# Patient Record
Sex: Female | Born: 1970
Health system: Southern US, Community
[De-identification: ages and names within clinical notes are randomized; demographics above are authoritative.]

## PROBLEM LIST (undated history)

## (undated) DIAGNOSIS — R112 Nausea with vomiting, unspecified: Secondary | ICD-10-CM

## (undated) DIAGNOSIS — G709 Myoneural disorder, unspecified: Secondary | ICD-10-CM

## (undated) DIAGNOSIS — E7800 Pure hypercholesterolemia, unspecified: Secondary | ICD-10-CM

## (undated) DIAGNOSIS — J309 Allergic rhinitis, unspecified: Secondary | ICD-10-CM

## (undated) DIAGNOSIS — Z9889 Other specified postprocedural states: Secondary | ICD-10-CM

## (undated) DIAGNOSIS — Z789 Other specified health status: Secondary | ICD-10-CM

## (undated) DIAGNOSIS — E785 Hyperlipidemia, unspecified: Secondary | ICD-10-CM

## (undated) DIAGNOSIS — K219 Gastro-esophageal reflux disease without esophagitis: Secondary | ICD-10-CM

## (undated) DIAGNOSIS — T8859XA Other complications of anesthesia, initial encounter: Secondary | ICD-10-CM

## (undated) DIAGNOSIS — T4145XA Adverse effect of unspecified anesthetic, initial encounter: Secondary | ICD-10-CM

## (undated) DIAGNOSIS — T7840XA Allergy, unspecified, initial encounter: Secondary | ICD-10-CM

## (undated) HISTORY — DX: Allergy, unspecified, initial encounter: T78.40XA

## (undated) HISTORY — DX: Hyperlipidemia, unspecified: E78.5

## (undated) HISTORY — DX: Allergic rhinitis, unspecified: J30.9

---

## 1998-04-10 ENCOUNTER — Emergency Department (HOSPITAL_COMMUNITY): Admission: EM | Admit: 1998-04-10 | Discharge: 1998-04-10 | Payer: Self-pay | Admitting: Emergency Medicine

## 1998-12-16 ENCOUNTER — Other Ambulatory Visit: Admission: RE | Admit: 1998-12-16 | Discharge: 1998-12-16 | Payer: Self-pay | Admitting: Nurse Practitioner

## 2003-01-02 ENCOUNTER — Other Ambulatory Visit: Admission: RE | Admit: 2003-01-02 | Discharge: 2003-01-02 | Payer: Self-pay | Admitting: Internal Medicine

## 2004-07-25 ENCOUNTER — Other Ambulatory Visit: Admission: RE | Admit: 2004-07-25 | Discharge: 2004-07-25 | Payer: Self-pay | Admitting: Obstetrics and Gynecology

## 2006-11-17 ENCOUNTER — Ambulatory Visit: Payer: Self-pay | Admitting: Internal Medicine

## 2007-06-23 ENCOUNTER — Ambulatory Visit: Payer: Self-pay | Admitting: Endocrinology

## 2007-06-23 DIAGNOSIS — J069 Acute upper respiratory infection, unspecified: Secondary | ICD-10-CM | POA: Insufficient documentation

## 2007-10-03 ENCOUNTER — Ambulatory Visit: Payer: Self-pay | Admitting: Internal Medicine

## 2007-10-03 DIAGNOSIS — H6120 Impacted cerumen, unspecified ear: Secondary | ICD-10-CM | POA: Insufficient documentation

## 2007-10-03 DIAGNOSIS — R42 Dizziness and giddiness: Secondary | ICD-10-CM

## 2007-10-03 DIAGNOSIS — J309 Allergic rhinitis, unspecified: Secondary | ICD-10-CM | POA: Insufficient documentation

## 2007-10-03 HISTORY — DX: Allergic rhinitis, unspecified: J30.9

## 2007-11-30 ENCOUNTER — Telehealth (INDEPENDENT_AMBULATORY_CARE_PROVIDER_SITE_OTHER): Payer: Self-pay | Admitting: *Deleted

## 2008-06-26 ENCOUNTER — Encounter: Payer: Self-pay | Admitting: Internal Medicine

## 2008-08-08 ENCOUNTER — Ambulatory Visit: Payer: Self-pay | Admitting: Internal Medicine

## 2008-08-08 DIAGNOSIS — E785 Hyperlipidemia, unspecified: Secondary | ICD-10-CM | POA: Insufficient documentation

## 2008-08-08 DIAGNOSIS — J019 Acute sinusitis, unspecified: Secondary | ICD-10-CM

## 2008-08-08 HISTORY — DX: Hyperlipidemia, unspecified: E78.5

## 2008-08-09 LAB — CONVERTED CEMR LAB
Cholesterol: 265 mg/dL — ABNORMAL HIGH (ref 0–200)
Total CHOL/HDL Ratio: 3
Triglycerides: 98 mg/dL (ref 0.0–149.0)

## 2009-02-20 ENCOUNTER — Ambulatory Visit: Payer: Self-pay | Admitting: Internal Medicine

## 2009-02-28 LAB — CONVERTED CEMR LAB
Direct LDL: 162.2 mg/dL
HDL: 72.4 mg/dL (ref >39.00)
Total CHOL/HDL Ratio: 4
Triglycerides: 121 mg/dL (ref 0.0–149.0)
VLDL: 24.2 mg/dL (ref 0.0–40.0)

## 2009-04-08 ENCOUNTER — Telehealth: Payer: Self-pay | Admitting: Internal Medicine

## 2009-06-17 ENCOUNTER — Ambulatory Visit: Payer: Self-pay | Admitting: Internal Medicine

## 2009-06-20 LAB — CONVERTED CEMR LAB
ALT: 12 units/L (ref 0–35)
Alkaline Phosphatase: 40 units/L (ref 39–117)
Bilirubin, Direct: 0.2 mg/dL (ref 0.0–0.3)
Total Bilirubin: 0.6 mg/dL (ref 0.3–1.2)
VLDL: 18.8 mg/dL (ref 0.0–40.0)

## 2009-08-30 ENCOUNTER — Ambulatory Visit: Payer: Self-pay | Admitting: Internal Medicine

## 2009-08-30 DIAGNOSIS — H6500 Acute serous otitis media, unspecified ear: Secondary | ICD-10-CM

## 2010-01-27 ENCOUNTER — Ambulatory Visit: Payer: Self-pay | Admitting: Internal Medicine

## 2010-01-27 DIAGNOSIS — M542 Cervicalgia: Secondary | ICD-10-CM

## 2010-03-10 ENCOUNTER — Ambulatory Visit: Payer: Self-pay | Admitting: Internal Medicine

## 2010-03-13 ENCOUNTER — Telehealth: Payer: Self-pay | Admitting: Internal Medicine

## 2010-03-18 ENCOUNTER — Telehealth: Payer: Self-pay | Admitting: Internal Medicine

## 2010-05-09 ENCOUNTER — Ambulatory Visit
Admission: RE | Admit: 2010-05-09 | Discharge: 2010-05-09 | Payer: Self-pay | Source: Home / Self Care | Attending: Internal Medicine | Admitting: Internal Medicine

## 2010-05-09 DIAGNOSIS — M7710 Lateral epicondylitis, unspecified elbow: Secondary | ICD-10-CM | POA: Insufficient documentation

## 2010-05-09 DIAGNOSIS — M7700 Medial epicondylitis, unspecified elbow: Secondary | ICD-10-CM | POA: Insufficient documentation

## 2010-05-23 ENCOUNTER — Ambulatory Visit
Admission: RE | Admit: 2010-05-23 | Discharge: 2010-05-23 | Payer: Self-pay | Source: Home / Self Care | Attending: Internal Medicine | Admitting: Internal Medicine

## 2010-05-23 ENCOUNTER — Encounter: Payer: Self-pay | Admitting: Internal Medicine

## 2010-05-23 DIAGNOSIS — R5383 Other fatigue: Secondary | ICD-10-CM

## 2010-05-23 DIAGNOSIS — M25529 Pain in unspecified elbow: Secondary | ICD-10-CM | POA: Insufficient documentation

## 2010-05-23 DIAGNOSIS — R5381 Other malaise: Secondary | ICD-10-CM | POA: Insufficient documentation

## 2010-05-24 ENCOUNTER — Telehealth: Payer: Self-pay | Admitting: Internal Medicine

## 2010-06-03 NOTE — Assessment & Plan Note (Signed)
Summary: pain behind right ear/cd   Vital Signs:  Patient profile:   40 year old female Height:      63 inches Weight:      129.38 pounds BMI:     23.00 O2 Sat:      99 % on Room air Temp:     98.1 degrees F oral Pulse rate:   61 / minute BP sitting:   90 / 60  (left arm) Cuff size:   regular  Vitals Entered By: Zella Ball Ewing CMA Duncan Dull) (January 27, 2010 4:07 PM)  O2 Flow:  Room air CC: Right ear pain, sore throat, drainage, neck soreness/RE   CC:  Right ear pain, sore throat, drainage, and neck soreness/RE.  History of Present Illness: here for acute - just back from [place]  - sister with colon cancer and CML;  she does not recall a recommendation for the immediate family to have colonoscopy; denies bowel change, abd pain, or blood   Also just got back to [place] at 2 am this am after 2 hr flight with todaty new onset swollen lump behind the angle of the right jaw  and below the ear, assoc with ear pain, fever, sinus congestion, headache, malaise, but no ST, cough and Pt denies CP, worsening sob, doe, wheezing, orthopnea, pnd, worsening LE edema, palps, dizziness or syncope .  This is on top of nasal allergy symtpoms worse in the past few wks, having gotten away from her allergy meds in the past month or so  also with right neck pain worse to turn to the right horizontally, mild to mod, onset x 3 days after reaching high at home and fell immed pain with lookiing up;  no pain unless she turns the head in a certain way, area is nontender to touch,  denies other neck pain, radicular/numbness/weakness of any extremity.    Problems Prior to Update: 1)  Neoplasm, Malignant, Colon, Family Hx, Sibling  (ICD-V16.0) 2)  Neck Pain, Right  (ICD-723.1) 3)  Sinusitis- Acute-nos  (ICD-461.9) 4)  Otitis Media, Serous, Acute, Left  (ICD-381.01) 5)  Hyperlipidemia  (ICD-272.4) 6)  Sinusitis- Acute-nos  (ICD-461.9) 7)  Vertigo  (ICD-780.4) 8)  Cerumen Impaction, Bilateral  (ICD-380.4) 9)  Allergic  Rhinitis  (ICD-477.9) 10)  Uri  (ICD-465.9)  Medications Prior to Update: 1)  Ortho Tri-Cyclen Lo 0.025 Mg  Tabs (Norgestimate-Ethinyl Estradiol) .Marland Kitchen.. 1 By Mouth Qd 2)  Nasacort Aq 55 Mcg/act  Aers (Triamcinolone Acetonide(Nasal)) .... 2 Spray/side Once Daily 3)  Simvastatin 40 Mg Tabs (Simvastatin) .Marland Kitchen.. 1 By Mouth Once Daily 4)  Cetirizine Hcl 10 Mg Tabs (Cetirizine Hcl) .Marland Kitchen.. 1 By Mouth Once Daily As Needed Allergies 5)  Prednisone 10 Mg Tabs (Prednisone) .... 3po Qd For 3days, Then 2po Qd For 3days, Then 1po Qd For 3days, Then Stop  Current Medications (verified): 1)  Ortho Tri-Cyclen Lo 0.025 Mg  Tabs (Norgestimate-Ethinyl Estradiol) .Marland Kitchen.. 1 By Mouth Qd 2)  Nasacort Aq 55 Mcg/act  Aers (Triamcinolone Acetonide(Nasal)) .... 2 Spray/side Once Daily 3)  Simvastatin 40 Mg Tabs (Simvastatin) .Marland Kitchen.. 1 By Mouth Once Daily 4)  Cetirizine Hcl 10 Mg Tabs (Cetirizine Hcl) .Marland Kitchen.. 1 By Mouth Once Daily As Needed Allergies 5)  Cephalexin 500 Mg Caps (Cephalexin) .Marland Kitchen.. 1po Three Times A Day 6)  Naproxen 500 Mg Tabs (Naproxen) .Marland Kitchen.. 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  , mild ill  Head:  normocephalic and atraumatic.   Eyes:  vision grossly intact, pupils equal, and pupils round.   Ears:  bilat tm's mild erythema, sinus tedner bilat Nose:  nasal dischargemucosal pallor and mucosal edema.   Mouth:  pharyngeal erythema and fair dentition.   Neck:  supple.  with mild neck tender LA < 1 cm just post to the angle  of the jaw, below the right ear;  no other LA to the neck or other mass Lungs:  normal respiratory effort and normal breath sounds.   Heart:  normal rate and regular rhythm.   Msk:  cspine nontender and paravertebral nontender, no trapezoid tender Extremities:  no edema, no erythema  Neurologic:  cranial nerves II-XII intact, strength normal in all extremities, sensation intact to light touch, and gait normal.     Impression & Recommendations:  Problem # 1:  SINUSITIS- ACUTE-NOS (ICD-461.9)  Her updated medication list for this problem includes:    Nasacort Aq 55 Mcg/act Aers (Triamcinolone acetonide(nasal)) .Marland Kitchen... 2 spray/side once daily    Cephalexin 500 Mg Caps (Cephalexin) .Marland Kitchen... 1po three times a day treat as above, f/u any worsening signs or symptoms   Problem # 2:  ALLERGIC RHINITIS (ICD-477.9)  Her updated medication list for this problem includes:    Nasacort Aq 55 Mcg/act Aers (Triamcinolone acetonide(nasal)) .Marland Kitchen... 2 spray/side once daily    Cetirizine Hcl 10 Mg Tabs (Cetirizine hcl) .Marland Kitchen... 1 by mouth once daily as needed allergies treat as above, f/u any worsening signs or symptoms   Problem # 3:  NECK PAIN, RIGHT (ICD-723.1)  Her updated medication list for this problem includes:    Naproxen 500 Mg Tabs (Naproxen) .Marland Kitchen... 1po two times a day as needed pain treat as above, f/u any worsening signs or symptoms - suspect underlying MSK strain, or underlying c-spine DJD/DDD  Problem # 4:  NEOPLASM, MALIGNANT, COLON, FAMILY HX, SIBLING (ICD-V16.0) pt to inquireand verify diagnosis, then let me know as she would need colnoscopy  Complete Medication List: 1)  Ortho Tri-cyclen Lo 0.025 Mg Tabs (Norgestimate-ethinyl estradiol) .Marland Kitchen.. 1 by mouth qd 2)  Nasacort Aq 55 Mcg/act Aers (Triamcinolone acetonide(nasal)) .... 2 spray/side once daily 3)  Simvastatin 40 Mg Tabs (Simvastatin) .Marland Kitchen.. 1 by mouth once daily 4)  Cetirizine Hcl 10 Mg Tabs (Cetirizine hcl) .Marland Kitchen.. 1 by mouth once daily as  needed allergies 5)  Cephalexin 500 Mg Caps (Cephalexin) .Marland Kitchen.. 1po three times a day 6)  Naproxen 500 Mg Tabs (Naproxen) .Marland Kitchen.. 1po two times a day as needed pain  Patient Instructions: 1)  please ask if your sister had colon cancer of some related manifestation of th CML in the colon  (if your sister had primary colon cancer in addition to the CML, you should call for referral for your own colonoscopy) 2)  Please take all new medications as prescribed 3)  Continue all previous medications as before this visit  4)  Please schedule a follow-up appointment in 6 months.with CPX labs Prescriptions: SIMVASTATIN 40 MG TABS (SIMVASTATIN) 1 by mouth once daily  #90 x 3   Entered and Authorized by:   Fayrene Fearing  Ellin Mayhew MD   Signed by:   Corwin Levins MD on 01/27/2010   Method used:   Electronically to        [facility] (retail)       [address]       [place], [place]  [address]       Ph: [phone]       Fax: [phone]   RxID:   [id number] NAPROXEN 500 MG TABS (NAPROXEN) 1po two times a day as needed pain  #60 x 2   Entered and Authorized by:   Corwin Levins MD   Signed by:   Corwin Levins MD on 01/27/2010   Method used:   Print then Give to Patient   RxID:   [id number] CEPHALEXIN 500 MG CAPS (CEPHALEXIN) 1po three times a day  #30 x 0   Entered and Authorized by:   Corwin Levins MD   Signed by:   Corwin Levins MD on 01/27/2010   Method used:   Print then Give to Patient   RxID:   [id number]

## 2010-06-03 NOTE — Assessment & Plan Note (Signed)
Summary: ALLERGIES/NWS   Vital Signs:  Patient profile:   40 year old female Height:      63 inches Weight:      130 pounds BMI:     23.11 O2 Sat:      99 % on Room air Temp:     98 degrees F oral Pulse rate:   55 / minute BP sitting:   92 / 54  (left arm) Cuff size:   regular  Vitals Entered ByZella Ball Ewing (August 30, 2009 4:10 PM)  O2 Flow:  Room air CC: Allergies/RE   CC:  Allergies/RE.  History of Present Illness: here wtih 2 to 3 wks marked onset sinus and nasal allergy symptoms wtih clearish drainaige, pressure, itch and sneeze, as well as left ear pressure, fullness with slight hearing loss but no vertigo, high fever or chills, ST, cough and Pt denies CP, sob, doe, wheezing, orthopnea, pnd, worsening LE edema, palps, dizziness or syncope .  Overall good med complaicne, and good tolerability - no myalgias or joint pains with the statin.  Trying to follow lower chol diet.    Problems Prior to Update: 1)  Otitis Media, Serous, Acute, Left  (ICD-381.01) 2)  Hyperlipidemia  (ICD-272.4) 3)  Sinusitis- Acute-nos  (ICD-461.9) 4)  Vertigo  (ICD-780.4) 5)  Cerumen Impaction, Bilateral  (ICD-380.4) 6)  Allergic Rhinitis  (ICD-477.9) 7)  Uri  (ICD-465.9)  Medications Prior to Update: 1)  Ortho Tri-Cyclen Lo 0.025 Mg  Tabs (Norgestimate-Ethinyl Estradiol) .Marland Kitchen.. 1 By Mouth Qd 2)  Nasacort Aq 55 Mcg/act  Aers (Triamcinolone Acetonide(Nasal)) .... 2 Spray/side Once Daily 3)  Azithromycin 250 Mg Tabs (Azithromycin) .... 2po Qd For 1 Day, Then 1po Qd For 4days, Then Stop 4)  Simvastatin 40 Mg Tabs (Simvastatin) .Marland Kitchen.. 1 By Mouth Once Daily  Current Medications (verified): 1)  Ortho Tri-Cyclen Lo 0.025 Mg  Tabs (Norgestimate-Ethinyl Estradiol) .Marland Kitchen.. 1 By Mouth Qd 2)  Nasacort Aq 55 Mcg/act  Aers (Triamcinolone Acetonide(Nasal)) .... 2 Spray/side Once Daily 3)  Simvastatin 40 Mg Tabs (Simvastatin) .Marland Kitchen.. 1 By Mouth Once Daily 4)  Cetirizine Hcl 10 Mg Tabs (Cetirizine Hcl) .Marland Kitchen.. 1 By Mouth  Once Daily As Needed Allergies 5)  Prednisone 10 Mg Tabs (Prednisone) .... 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   Head:  normocephalic and atraumatic.   Eyes:  vision grossly intact, pupils equal, and pupils round.   Ears:  left tm mild erythema with post fluid, no bulging, canals clear Nose:  nasal dischargemucosal pallor and mucosal edema.   Mouth:  pharyngeal erythema and fair dentition.   Neck:  supple and no masses.   Lungs:  normal respiratory effort and normal breath sounds.   Heart:  normal rate and regular rhythm.   Extremities:  no edema, no erythema    Impression & Recommendations:  Problem # 1:  ALLERGIC RHINITIS (ICD-477.9)  Her updated medication list for this problem includes:    Nasacort Aq 55 Mcg/act Aers (Triamcinolone  acetonide(nasal)) .Marland Kitchen... 2 spray/side once daily    Cetirizine Hcl 10 Mg Tabs (Cetirizine hcl) .Marland Kitchen... 1 by mouth once daily as needed allergies  Orders: Depo- Medrol 40mg  (J1030) Depo- Medrol 80mg  (J1040) Admin of Therapeutic Inj  intramuscular or subcutaneous ([address]) moderate symptoms with seasonal flare;  tx with depo shot, prednisone burst adn taper off, and treat as above, f/u any worsening signs or symptoms   Problem # 2:  OTITIS MEDIA, SEROUS, ACUTE, LEFT (ICD-381.01) mild , related to above, treat as above, f/u any worsening signs or symptoms   Problem # 3:  HYPERLIPIDEMIA (ICD-272.4)  Her updated  medication list for this problem includes:    Simvastatin 40 Mg Tabs (Simvastatin) .Marland Kitchen... 1 by mouth once daily  Labs Reviewed: SGOT: 16 (06/17/2009)   SGPT: 12 (06/17/2009)   HDL:91.60 (06/17/2009), 72.40 (02/20/2009)  LDL:86 (06/17/2009)  Chol:196 (06/17/2009), 254 (02/20/2009)  Trig:94.0 (06/17/2009), 121.0 (02/20/2009) d/w pt , delcines f/u labs today, Continue all previous medications as before this visit , cont diet and meds  Complete Medication List: 1)  Ortho Tri-cyclen Lo 0.025 Mg Tabs (Norgestimate-ethinyl estradiol) .Marland Kitchen.. 1 by mouth qd 2)  Nasacort Aq 55 Mcg/act Aers (Triamcinolone acetonide(nasal)) .... 2 spray/side once daily 3)  Simvastatin 40 Mg Tabs (Simvastatin) .Marland Kitchen.. 1 by mouth once daily 4)  Cetirizine Hcl 10 Mg Tabs (Cetirizine hcl) .Marland Kitchen.. 1 by mouth once daily as needed allergies 5)  Prednisone 10 Mg Tabs (Prednisone) .... 3po qd for 3days, then 2po qd for 3days, then 1po qd for 3days, then stop  Patient Instructions: 1)  you had the steroid shot today 2)  Please take all new medications as prescribed 3)  Please schedule a follow-up appointment in Feb 2012 with CPX labs Prescriptions: PREDNISONE 10 MG TABS (PREDNISONE) 3po qd for 3days, then 2po qd for 3days, then 1po qd for 3days, then stop  #18 x 0   Entered and Authorized by:   Corwin Levins MD   Signed by:   Corwin Levins MD on 08/30/2009   Method used:   Print then Give to Patient   RxID:   [id number] CETIRIZINE HCL 10 MG TABS (CETIRIZINE HCL) 1 by mouth once daily as needed allergies  #30 x 11   Entered and Authorized by:   Corwin Levins MD   Signed by:   Corwin Levins MD on 08/30/2009   Method used:   Print then Give to Patient   RxID:   [id number] NASACORT AQ 55 MCG/ACT  AERS (TRIAMCINOLONE ACETONIDE(NASAL)) 2 spray/side once daily  #1 x 11   Entered and Authorized by:   Corwin Levins MD   Signed by:   Corwin Levins MD on 08/30/2009   Method used:   Print then Give to Patient   RxID:    [id number]    Medication Administration  Injection # 1:    Medication: Depo- Medrol 40mg     Diagnosis: ALLERGIC RHINITIS (ICD-477.9)    Route: IM    Site: LUOQ gluteus    Exp Date: 03/2012    Lot #: [id number]    Mfr: Pharm[employer]    Given by: Zella Ball Ewing (August 30, 2009 4:52 PM)  Injection # 2:    Medication: Depo- Medrol 80mg     Diagnosis: ALLERGIC RHINITIS (ICD-477.9)    Route: IM    Site: LUOQ gluteus    Exp Date: 03/2012    Lot #: [id number]    Mfr: [employer]    Given by: Scharlene Gloss (August 30, 2009 4:52 PM)  Orders Added: 1)  Depo- Medrol 40mg  [J1030] 2)  Depo- Medrol 80mg  [J1040] 3)  Admin of Therapeutic Inj  intramuscular or subcutaneous [96372] 4)  Est. Patient Level IV [62130]

## 2010-06-03 NOTE — Progress Notes (Signed)
Summary: pull muscle  Phone Note Call from Patient Call back at Home Phone [phone]   Caller: Patient/[phone] until 3:30 cell//[phone] Call For: dr Jonny Ruiz Summary of Call: per pt call  c/o of pull muscle in feet , and toe getting them everyday . what does dr Jonny Ruiz advise per pt Initial call taken by: Shelbie Proctor,  November 30, 2007 9:13 AM  Follow-up for Phone Call        probable cramping - can try muscle relaxer but if persists will need OV - flexeril done hardcopy Follow-up by: Corwin Levins MD,  November 30, 2007 10:59 AM  Additional Follow-up for Phone Call Additional follow up Details #1::        Phone Call Completed, Rx Called In, Provider Notified pt notified that hardcopy of the rx was faxed to [facility] [address] [place], [place]  [address] Ph: [phone] Fax: [phone] Additional Follow-up by: Shelbie Proctor,  November 30, 2007 11:04 AM    New/Updated Medications: FLEXERIL 5 MG  TABS (CYCLOBENZAPRINE HCL) 1 by mouth three times a day as needed   Prescriptions: FLEXERIL 5 MG  TABS (CYCLOBENZAPRINE HCL) 1 by mouth three times a day as needed  #40 x 0   Entered and Authorized by:   Corwin Levins MD   Signed by:   Corwin Levins MD on 11/30/2007   Method used:   Print then Give to Patient   RxID:   [id number]

## 2010-06-03 NOTE — Progress Notes (Signed)
Summary: ALLERGY MED  Phone Note Call from Patient Call back at Work Phone [phone]   Caller: Patient Summary of Call: Pt called stating she is still having the same sxs and thinks it may be allergies. Pt is requesting Rx for allergy meds or does she need OV? Initial call taken by: Margaret Pyle, CMA,  March 18, 2010 10:26 AM  Follow-up for Phone Call        I would try OTC allegra first to see if this helps (or zyrtec though this can be sedating for a few people) Follow-up by: Corwin Levins MD,  March 18, 2010 12:19 PM  Additional Follow-up for Phone Call Additional follow up Details #1::        Pt informed  Additional Follow-up by: Lamar Sprinkles, CMA,  March 18, 2010 12:31 PM

## 2010-06-03 NOTE — Assessment & Plan Note (Signed)
Summary: head/chest congestion/cd   Vital Signs:  Patient profile:   40 year old female Height:      64 inches Weight:      130 pounds BMI:     22.40 O2 Sat:      98 % on Room air Temp:     98.4 degrees F oral Pulse rate:   54 / minute BP sitting:   98 / 62  (left arm) Cuff size:   regular  Vitals Entered By: Zella Ball Ewing CMA (AAMA) (March 10, 2010 3:52 PM)  O2 Flow:  Room air CC: Nasal congestion, sore throat/RE   CC:  Nasal congestion and sore throat/RE.  History of Present Illness: here for acute c/o 3 days mild to mod fever, facial pain, pressure and greenish d/c, with mild ST, but no cough and Pt denies CP, worsening sob, doe, wheezing, orthopnea, pnd, worsening LE edema, palps, dizziness or syncope  Pt denies new neuro symptoms such as headache, facial or extremity weakness  No  wt loss, night sweats, loss of appetite or other constitutional symptoms   Preventive Screening-Counseling & Management      Drug Use:  no.    Problems Prior to Update: 1)  Neoplasm, Malignant, Colon, Family Hx, Sibling  (ICD-V16.0) 2)  Neck Pain, Right  (ICD-723.1) 3)  Sinusitis- Acute-nos  (ICD-461.9) 4)  Otitis Media, Serous, Acute, Left  (ICD-381.01) 5)  Hyperlipidemia  (ICD-272.4) 6)  Sinusitis- Acute-nos  (ICD-461.9) 7)  Vertigo  (ICD-780.4) 8)  Cerumen Impaction, Bilateral  (ICD-380.4) 9)  Allergic Rhinitis  (ICD-477.9) 10)  Uri  (ICD-465.9)  Medications Prior to Update: 1)  Ortho Tri-Cyclen Lo 0.025 Mg  Tabs (Norgestimate-Ethinyl Estradiol) .Marland Kitchen.. 1 By Mouth Qd 2)  Nasacort Aq 55 Mcg/act  Aers (Triamcinolone Acetonide(Nasal)) .... 2 Spray/side Once Daily 3)  Simvastatin 40 Mg Tabs (Simvastatin) .Marland Kitchen.. 1 By Mouth Once Daily 4)  Cetirizine Hcl 10 Mg Tabs (Cetirizine Hcl) .Marland Kitchen.. 1 By Mouth Once Daily As Needed Allergies 5)  Cephalexin 500 Mg Caps (Cephalexin) .Marland Kitchen.. 1po Three Times A Day 6)  Naproxen 500 Mg Tabs (Naproxen) .Marland Kitchen.. 1po Two Times A Day As Needed Pain  Current Medications  (verified): 1)  Ortho Tri-Cyclen Lo 0.025 Mg  Tabs (Norgestimate-Ethinyl Estradiol) .Marland Kitchen.. 1 By Mouth Qd 2)  Nasacort Aq 55 Mcg/act  Aers (Triamcinolone Acetonide(Nasal)) .... 2 Spray/side Once Daily 3)  Simvastatin 40 Mg Tabs (Simvastatin) .Marland Kitchen.. 1 By Mouth Once Daily 4)  Cetirizine Hcl 10 Mg Tabs (Cetirizine Hcl) .Marland Kitchen.. 1 By Mouth Once Daily As Needed Allergies 5)  Levofloxacin 500 Mg Tabs (Levofloxacin) .Marland Kitchen.. 1po Once Daily 6)  Naproxen 500 Mg Tabs (Naproxen) .Marland Kitchen.. 1po Two Times A Day As Needed Pain 7)  Sudahist 12-120 Mg Xr12h-Tab (Chlorpheniramine-Pseudoeph) .Marland Kitchen.. 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   Eyes:  vision grossly intact, pupils equal,  and pupils round.   Ears:  bilat tm's red, sinus tender bilat Nose:  nasal dischargemucosal pallor and mucosal edema.   Mouth:  pharyngeal erythema and fair dentition.   Neck:  supple and cervical lymphadenopathy.   Lungs:  normal respiratory effort and normal breath sounds.   Heart:  normal rate and regular rhythm.   Extremities:  no edema, no erythema    Impression & Recommendations:  Problem # 1:  SINUSITIS- ACUTE-NOS (ICD-461.9)  Her updated medication list for this problem includes:    Nasacort Aq 55 Mcg/act Aers (Triamcinolone acetonide(nasal)) .Marland Kitchen... 2 spray/side  once daily    Levofloxacin 500 Mg Tabs (Levofloxacin) .Marland Kitchen... 1po once daily    Sudahist 12-120 Mg Xr12h-tab (Chlorpheniramine-pseudoeph) .Marland Kitchen... 1po two times a day as needed congestion treat as above, f/u any worsening signs or symptoms   Complete Medication List: 1)  Ortho Tri-cyclen Lo 0.025 Mg Tabs (Norgestimate-ethinyl estradiol) .Marland Kitchen.. 1 by mouth qd 2)  Nasacort Aq 55 Mcg/act Aers (Triamcinolone acetonide(nasal)) .... 2 spray/side once daily 3)  Simvastatin 40 Mg Tabs (Simvastatin) .Marland Kitchen.. 1 by mouth once daily 4)  Cetirizine Hcl 10 Mg Tabs (Cetirizine hcl) .Marland Kitchen.. 1 by mouth once daily as needed allergies 5)  Levofloxacin 500 Mg Tabs (Levofloxacin) .Marland Kitchen.. 1po once daily 6)  Naproxen 500 Mg Tabs (Naproxen) .Marland Kitchen.. 1po two times a day as needed pain 7)  Sudahist 12-120 Mg Xr12h-tab (Chlorpheniramine-pseudoeph) .Marland Kitchen.. 1po two times a day as needed congestion  Patient Instructions: 1)  Please take all new medications as prescribed 2)  Continue all previous medications as before this visit  3)  Please schedule a follow-up appointment in 4 months with CPX labs Prescriptions: SUDAHIST 12-120 MG XR12H-TAB (CHLORPHENIRAMINE-PSEUDOEPH) 1po two times a day as needed congestion  #60 x 1   Entered and Authorized by:   Corwin Levins MD   Signed by:   Corwin Levins MD on 03/10/2010   Method used:   Print then Give to Patient   RxID:   [id number] LEVOFLOXACIN 500 MG TABS (LEVOFLOXACIN) 1po once daily  #10 x 0   Entered and Authorized by:   Corwin Levins MD   Signed by:   Corwin Levins MD on 03/10/2010   Method used:   Print then Give to Patient   RxID:   [id number]    Orders Added: 1)  Est. Patient Level III [84696]

## 2010-06-03 NOTE — Progress Notes (Signed)
Summary: ATL med  Phone Note Call from Patient Call back at Home Phone [phone]   Caller: Patient Summary of Call: Pt called stating that she has been experiencing severe leg and calf pain since starting ABX, pt is requesting alternative medication. Initial call taken by: Margaret Pyle, CMA,  March 13, 2010 4:05 PM  Follow-up for Phone Call        ok to change to zpack  - done per emr Follow-up by: Corwin Levins MD,  March 13, 2010 5:13 PM  Additional Follow-up for Phone Call Additional follow up Details #1::        Pt informed, added antibiotic to allergies Additional Follow-up by: Lamar Sprinkles, CMA,  March 13, 2010 5:45 PM   New Allergies: ! LEVAQUIN (LEVOFLOXACIN) New/Updated Medications: AZITHROMYCIN 250 MG TABS (AZITHROMYCIN) 2po qd for 1 day, then 1po qd for 4days, then stop New Allergies: ! LEVAQUIN (LEVOFLOXACIN)Prescriptions: AZITHROMYCIN 250 MG TABS (AZITHROMYCIN) 2po qd for 1 day, then 1po qd for 4days, then stop  #6 x 1   Entered and Authorized by:   Corwin Levins MD   Signed by:   Corwin Levins MD on 03/13/2010   Method used:   Electronically to        [facility] (retail)       [address]       [place], [place]  [address]       Ph: [phone]       Fax: [phone]   RxID:   [id number]

## 2010-06-05 NOTE — Assessment & Plan Note (Signed)
Summary: TENDONITIS IN RIGHT ELBOW/CORTISONE SHOT PER JWJ/LB   Vital Signs:  Patient profile:   40 year old female Height:      63 inches Weight:      135 pounds BMI:     24.00 Temp:     98.9 degrees F oral Pulse rate:   80 / minute Pulse rhythm:   regular Resp:     16 per minute BP sitting:   110 / 70  (left arm) Cuff size:   regular  Vitals Entered By: Lanier Prude, CMA(AAMA) (May 23, 2010 4:10 PM)  Procedure Note  Injections: The patient complains of pain. Indication: chronic pain Consent signed: yes  Procedure # 1: joint injection    Region: lateral    Location: R elbow    Technique: 25 g needle    Medication: 40 mg depomedrol    Anesthesia: 3.0 ml 1% lidocaine w/o epinephrine    Comment: Risks including but not limited by incomplete procedure, bleeding, infection, recurrence were discussed with the patient. Consent form was signed. Tolerated well. Complicatons - see note. Good pain relief following the procedure in the lat. elbow.   Cleaned and prepped with: alcohol and betadine Wound dressing: bandaid Instructions: ice  CC: Rt elbow pain X 2wks Is Patient Diabetic? No Comments pt states Flexeril and Meloxicam are not relieving her pain   CC:  Rt elbow pain X 2wks.  History of Present Illness: C/o R elbow pain (more in the outer elbow than in inner elbow)  x 2 months, severe at times; worse with holding something in her hand with a  stretched arm. It started after painting w/a roller...  Current Medications (verified): 1)  Ortho Tri-Cyclen Lo 0.025 Mg  Tabs (Norgestimate-Ethinyl Estradiol) .Marland Kitchen.. 1 By Mouth Qd 2)  Nasacort Aq 55 Mcg/act  Aers (Triamcinolone Acetonide(Nasal)) .... 2 Spray/side Once Daily 3)  Simvastatin 40 Mg Tabs (Simvastatin) .Marland Kitchen.. 1 By Mouth Once Daily 4)  Cetirizine Hcl 10 Mg Tabs (Cetirizine Hcl) .Marland Kitchen.. 1 By Mouth Once Daily As Needed Allergies 5)  Meloxicam 15 Mg Tabs (Meloxicam) .Marland Kitchen.. 1po Once Daily As Needed 6)  Sudahist 12-120 Mg  Xr12h-Tab (Chlorpheniramine-Pseudoeph) .Marland Kitchen.. 1po Two Times A Day As Needed Congestion 7)  Flexeril 5 Mg Tabs (Cyclobenzaprine Hcl) .Marland Kitchen.. 1 By Mouth Three Times A Day As Needed  Allergies (verified): 1)  ! 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    Physical Exam  General:  alert and well-developed.   Msk:  mod to severe right lateral epicondylar and lat prox forearm tenderness w/o swelling, without erythema or rash;  also mild right medial epicondylar tenderness as well wthout sweling, and no erythema Skin:  WNL Psych:  anxious about her condition   Impression & Recommendations:  Problem # 1:  LATERAL EPICONDYLITIS, RIGHT (ICD-726.32) Assessment Deteriorated Options - meds vs injection discussed. She opted for an injection. See "Patient Instructions" and the Procedure note.  After the procedure the pt has developed weakness in R hand and arm with a loss of sensation from her elbow and down. The exam was positive for decreased sensation. Muscle strength was OK. She was very anxious. She was observed for >30 min. I asked her to wear a glove, use arm sling, ACE wrap.  See tel note 1/21 - better.  Tel note on 1/23 - symptoms and pain all resolved  Problem # 2:  MEDIAL EPICONDYLITIS, RIGHT (ICD-726.31) Assessment: Improved  Complete Medication List: 1)  Ortho Tri-cyclen Lo 0.025 Mg Tabs (Norgestimate-ethinyl estradiol) .Marland Kitchen.. 1 by mouth qd 2)  Nasacort Aq 55 Mcg/act Aers (Triamcinolone acetonide(nasal)) .... 2 spray/side once daily 3)  Simvastatin 40 Mg Tabs (Simvastatin) .Marland Kitchen.. 1 by mouth once daily 4)  Cetirizine Hcl 10 Mg Tabs (Cetirizine hcl) .Marland Kitchen.. 1 by mouth once daily as needed allergies 5)  Meloxicam 15 Mg Tabs (Meloxicam) .Marland Kitchen.. 1po  once daily as needed 6)  Sudahist 12-120 Mg Xr12h-tab (Chlorpheniramine-pseudoeph) .Marland Kitchen.. 1po two times a day as needed congestion 7)  Flexeril 5 Mg Tabs (Cyclobenzaprine hcl) .Marland Kitchen.. 1 by mouth three times a day as needed 8)  Prednisone 10 Mg Tabs (Prednisone) .... Take 40mg  qd for 3 days, then 20 mg qd for 3 days, then 10mg  qd for 6 days, then stop. take pc. 9)  Tramadol Hcl 50 Mg Tabs (Tramadol hcl) .Marland Kitchen.. 1-2 tabs by mouth two times a day as needed pain  Other Orders: Joint Aspirate / Injection, Intermediate (54098) Depo-Medrol 20mg  (J1020) Ace Wraps 3-5 in/yard  (J1914) Slings- All Types (N8295)  Patient Instructions: 1)  Call if you are not better in a reasonable amount of time or if worse.  2)  Ice/heat 3)  No hard work! Prescriptions: TRAMADOL HCL 50 MG TABS (TRAMADOL HCL) 1-2 tabs by mouth two times a day as needed pain  #100 x 1   Entered and Authorized by:   Tresa Garter MD   Signed by:   Tresa Garter MD on 05/23/2010   Method used:   Print then Give to Patient   RxID:   [id number] PREDNISONE 10 MG TABS (PREDNISONE) Take 40mg  qd for 3 days, then 20 mg qd for 3 days, then 10mg  qd for 6 days, then stop. Take pc.  #24 x 1   Entered and Authorized by:   Tresa Garter MD   Signed by:   Tresa Garter MD on 05/23/2010   Method used:   Print then Give to Patient   RxID:   [id number]    Orders Added: 1)  Joint Aspirate / Injection, Intermediate [20605] 2)  Depo-Medrol 20mg  [J1020] 3)  Ace Wraps 3-5 in/yard  [A6449] 4)  Slings- All Types [A4565] 5)  Est. Patient Level IV [36644]

## 2010-06-05 NOTE — Assessment & Plan Note (Signed)
Summary: ?pain elbow/cd   Vital Signs:  Patient profile:   40 year old female Height:      63 inches Weight:      132.13 pounds BMI:     23.49 O2 Sat:      99 % on Room air Temp:     98.2 degrees F oral Pulse rate:   64 / minute BP sitting:   100 / 62  (left arm) Cuff size:   regular  Vitals Entered By: Zella Ball Ewing CMA (AAMA) (May 09, 2010 4:07 PM)  O2 Flow:  Room air CC: Right Elbow pain/RE   CC:  Right Elbow pain/RE.  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  Now c/o marked tender, swelling to the right elbow , both to the lateral and medial aspects though no pain or swelling or tender to the tip of the right elbow or decreased ROM;  overall gradually worse x 3 wks, dull, worse to try pick up even small objects such as ipad, better to rest the arm and use the left.  No specific trauma, fever, other injury or fall.  No left elbow pain or other joint issue.   Also for excercise lifts free wts in the AM about 10 lbs but could not this am due to the pain.  Pt denies CP, worsening sob, doe, wheezing, orthopnea, pnd, worsening LE edema, palps, dizziness or syncope  Pt denies new neuro symptoms such as headache, facial or extremity weakness  Pt denies polydipsia, polyuria    Overall good compliance with meds except   Also with several wks increased nasal allergy symtpoms not well controlled as she has stopped her meds recently,  but no fever, facial pain, colored d/c, ST or cough.    Problems Prior to Update: 1)  Medial Epicondylitis, Right  (ICD-726.31) 2)  Lateral Epicondylitis, Right  (ICD-726.32) 3)  Neoplasm, Malignant, Colon, Family Hx, Sibling  (ICD-V16.0) 4)  Neck Pain, Right  (ICD-723.1) 5)  Sinusitis- Acute-nos  (ICD-461.9) 6)  Otitis Media, Serous, Acute, Left   (ICD-381.01) 7)  Hyperlipidemia  (ICD-272.4) 8)  Sinusitis- Acute-nos  (ICD-461.9) 9)  Vertigo  (ICD-780.4) 10)  Cerumen Impaction, Bilateral  (ICD-380.4) 11)  Allergic Rhinitis  (ICD-477.9) 12)  Uri  (ICD-465.9)  Medications Prior to Update: 1)  Ortho Tri-Cyclen Lo 0.025 Mg  Tabs (Norgestimate-Ethinyl Estradiol) .Marland Kitchen.. 1 By Mouth Qd 2)  Nasacort Aq 55 Mcg/act  Aers (Triamcinolone Acetonide(Nasal)) .... 2 Spray/side Once Daily 3)  Simvastatin 40 Mg Tabs (Simvastatin) .Marland Kitchen.. 1 By Mouth Once Daily 4)  Cetirizine Hcl 10 Mg Tabs (Cetirizine Hcl) .Marland Kitchen.. 1 By Mouth Once Daily As Needed Allergies 5)  Azithromycin 250 Mg Tabs (Azithromycin) .... 2po Qd For 1 Day, Then 1po Qd For 4days, Then Stop 6)  Naproxen 500 Mg Tabs (Naproxen) .Marland Kitchen.. 1po Two Times A Day As Needed Pain 7)  Sudahist 12-120 Mg Xr12h-Tab (Chlorpheniramine-Pseudoeph) .Marland Kitchen.. 1po Two Times A Day As Needed Congestion  Current Medications (verified): 1)  Ortho Tri-Cyclen Lo 0.025 Mg  Tabs (Norgestimate-Ethinyl Estradiol) .Marland Kitchen.. 1 By Mouth Qd 2)  Nasacort Aq 55 Mcg/act  Aers (Triamcinolone Acetonide(Nasal)) .... 2 Spray/side Once Daily 3)  Simvastatin 40 Mg  Tabs (Simvastatin) .Marland Kitchen.. 1 By Mouth Once Daily 4)  Cetirizine Hcl 10 Mg Tabs (Cetirizine Hcl) .Marland Kitchen.. 1 By Mouth Once Daily As Needed Allergies 5)  Meloxicam 15 Mg Tabs (Meloxicam) .Marland Kitchen.. 1po Once Daily As Needed 6)  Sudahist 12-120 Mg Xr12h-Tab (Chlorpheniramine-Pseudoeph) .Marland Kitchen.. 1po Two Times A Day As Needed Congestion 7)  Flexeril 5 Mg Tabs (Cyclobenzaprine Hcl) .Marland Kitchen.. 1 By Mouth Three Times A Day As Needed  Allergies (verified): 1)  ! Levaquin (Levofloxacin)  Past History:  Past Medical History: Last updated: 08/08/2008 Allergic rhinitis Hyperlipidemia  Past Surgical History: Last updated: 10/03/2007 Denies surgical history  Social History: Last updated: 03/10/2010 Married 1 child work - cust service Never Smoked Alcohol use-no Drug use-no  Risk Factors: Smoking Status: never  (10/03/2007)  Review of Systems       all otherwise negative per pt -    Physical Exam  General:  alert and well-developed.   Head:  normocephalic and atraumatic.   Eyes:  vision grossly intact, pupils equal, and pupils round.   Ears:  bialt tm's mild erythema, sinus nontender Nose:  nasal dischargemucosal pallor and mucosal edema.   Mouth:  pharyngeal erythema and fair dentition.   Neck:  supple and no masses.   Lungs:  normal respiratory effort and normal breath sounds.   Heart:  normal rate and regular rhythm.   Msk:  mod to severe right lateral epicondylar tenderness and midl swelling, without erythema or rash;  also mild to mod right medial epicondylar tender as well wthout sweling, and no erythema/tender Extremities:  no edema, no erythema  Neurologic:  strength normal in all extremities and sensation intact to light touch.     Impression & Recommendations:  Problem # 1:  LATERAL EPICONDYLITIS, RIGHT (ICD-726.32) moderate;  for nsaid as needed, rest and tylenol as needed,  to avoid free wts and painting for at least 2 wks  Problem # 2:  MEDIAL EPICONDYLITIS, RIGHT (ICD-726.31) mild, treat as above, f/u any worsening signs or symptoms , advised rest as above, pt educated and reassured overall  Problem # 3:  ALLERGIC RHINITIS (ICD-477.9)  Her updated medication list for this problem includes:    Nasacort Aq 55 Mcg/act Aers (Triamcinolone acetonide(nasal)) .Marland Kitchen... 2 spray/side once daily    Cetirizine Hcl 10 Mg Tabs (Cetirizine hcl) .Marland Kitchen... 1 by mouth once daily as needed allergies mild, to re-start meds as above, treat as above, f/u any worsening signs or symptoms   Discussed use of allergy medications and environmental measures.   Complete Medication List: 1)  Ortho Tri-cyclen Lo 0.025 Mg Tabs (Norgestimate-ethinyl estradiol) .Marland Kitchen.. 1 by mouth qd 2)  Nasacort Aq 55 Mcg/act Aers (Triamcinolone acetonide(nasal)) .... 2 spray/side once daily 3)  Simvastatin 40 Mg Tabs  (Simvastatin) .Marland Kitchen.. 1 by mouth once daily 4)  Cetirizine Hcl 10 Mg Tabs (Cetirizine hcl) .Marland Kitchen.. 1 by mouth once daily as needed allergies 5)  Meloxicam 15 Mg Tabs (Meloxicam) .Marland Kitchen.. 1po once daily as needed 6)  Sudahist 12-120 Mg Xr12h-tab (Chlorpheniramine-pseudoeph) .Marland Kitchen.. 1po two times a day as needed congestion 7)  Flexeril 5 Mg Tabs (Cyclobenzaprine hcl) .Marland Kitchen.. 1 by mouth three times a day as needed  Patient Instructions: 1)  Please take all new medications as prescribed 2)  Continue all previous medications as before this visit  3)  Please schedule a follow-up appointment as needed. Prescriptions: FLEXERIL 5 MG TABS (CYCLOBENZAPRINE HCL) 1 by mouth three times a day as needed  #60 x 1   Entered and Authorized  by:   Corwin Levins MD   Signed by:   Corwin Levins MD on 05/09/2010   Method used:   Print then Give to Patient   RxID:   [id number] MELOXICAM 15 MG TABS (MELOXICAM) 1po once daily as needed  #30 x 1   Entered and Authorized by:   Corwin Levins MD   Signed by:   Corwin Levins MD on 05/09/2010   Method used:   Print then Give to Patient   RxID:   [id number]    Orders Added: 1)  Est. Patient Level IV [53976]

## 2010-06-05 NOTE — Progress Notes (Signed)
Summary: UPDATE  Phone Note Outgoing Call   Call placed by: Plot Summary of Call: Called to check on pt. Her wrist started to hurt today. Her hand is back to nl. Her elbow is better Initial call taken by: Tresa Garter MD,  May 24, 2010 11:46 AM  Follow-up for Phone Call        Pls call to check on her on Mon Thank you!  Follow-up by: Tresa Garter MD,  May 25, 2010 12:48 PM  Additional Follow-up for Phone Call Additional follow up Details #1::        Spoke w/pt - she is doing much better, hand is back to normal and pain is gone.  Additional Follow-up by: Lamar Sprinkles, CMA,  May 26, 2010 3:57 PM    Additional Follow-up for Phone Call Additional follow up Details #2::    Great! Thank you!  Follow-up by: Tresa Garter MD,  May 26, 2010 5:27 PM

## 2010-06-05 NOTE — Miscellaneous (Signed)
Summary: [facility] Elbow [facility]jection/[facility]  [facility]/ [facility]   Imported By: Sherian Rein 05/29/2010 09:41:44  _____________________________________________________________________  External Attachment:    Type:   Image     Comment:   External Document

## 2010-07-28 ENCOUNTER — Encounter: Payer: Self-pay | Admitting: Internal Medicine

## 2010-07-28 ENCOUNTER — Ambulatory Visit (INDEPENDENT_AMBULATORY_CARE_PROVIDER_SITE_OTHER): Payer: PRIVATE HEALTH INSURANCE | Admitting: Internal Medicine

## 2010-07-28 ENCOUNTER — Other Ambulatory Visit (INDEPENDENT_AMBULATORY_CARE_PROVIDER_SITE_OTHER): Payer: PRIVATE HEALTH INSURANCE

## 2010-07-28 ENCOUNTER — Other Ambulatory Visit (INDEPENDENT_AMBULATORY_CARE_PROVIDER_SITE_OTHER): Payer: PRIVATE HEALTH INSURANCE | Admitting: Internal Medicine

## 2010-07-28 VITALS — BP 100/60 | HR 63 | Temp 98.6°F | Ht 63.0 in | Wt 135.0 lb

## 2010-07-28 DIAGNOSIS — E785 Hyperlipidemia, unspecified: Secondary | ICD-10-CM

## 2010-07-28 DIAGNOSIS — Z0000 Encounter for general adult medical examination without abnormal findings: Secondary | ICD-10-CM

## 2010-07-28 DIAGNOSIS — M7711 Lateral epicondylitis, right elbow: Secondary | ICD-10-CM

## 2010-07-28 DIAGNOSIS — J309 Allergic rhinitis, unspecified: Secondary | ICD-10-CM

## 2010-07-28 DIAGNOSIS — M7710 Lateral epicondylitis, unspecified elbow: Secondary | ICD-10-CM

## 2010-07-28 LAB — CBC WITH DIFFERENTIAL/PLATELET
Basophils Absolute: 0 10*3/uL (ref 0.0–0.1)
Eosinophils Absolute: 0.3 10*3/uL (ref 0.0–0.7)
HCT: 37.2 % (ref 36.0–46.0)
Lymphs Abs: 2.5 10*3/uL (ref 0.7–4.0)
MCHC: 33.5 g/dL (ref 30.0–36.0)
Monocytes Absolute: 0.4 10*3/uL (ref 0.1–1.0)
Monocytes Relative: 5.9 % (ref 3.0–12.0)
Neutro Abs: 4.1 10*3/uL (ref 1.4–7.7)
Platelets: 281 10*3/uL (ref 150.0–400.0)
RDW: 12.9 % (ref 11.5–14.6)

## 2010-07-28 LAB — BASIC METABOLIC PANEL
Calcium: 8.8 mg/dL (ref 8.4–10.5)
GFR: 99.5 mL/min (ref >60.00)
Glucose, Bld: 93 mg/dL (ref 70–99)
Potassium: 4 mEq/L (ref 3.5–5.1)
Sodium: 135 mEq/L (ref 135–145)

## 2010-07-28 LAB — URINALYSIS, ROUTINE W REFLEX MICROSCOPIC
Leukocytes, UA: NEGATIVE
Nitrite: NEGATIVE
Specific Gravity, Urine: <=1.005 (ref 1.000–1.030)
Urobilinogen, UA: 0.2 (ref 0.0–1.0)
pH: 6 (ref 5.0–8.0)

## 2010-07-28 LAB — LIPID PANEL
Cholesterol: 207 mg/dL — ABNORMAL HIGH (ref 0–200)
HDL: 92.9 mg/dL (ref >39.00)
VLDL: 33 mg/dL (ref 0.0–40.0)

## 2010-07-28 LAB — HEPATIC FUNCTION PANEL
Albumin: 4 g/dL (ref 3.5–5.2)
Total Bilirubin: 0.8 mg/dL (ref 0.3–1.2)

## 2010-07-28 LAB — TSH: TSH: 1.29 u[IU]/mL (ref 0.35–5.50)

## 2010-07-28 MED ORDER — CETIRIZINE HCL 10 MG PO TABS: 10.0000 mg | ORAL_TABLET | Freq: Every day | ORAL | Status: DC

## 2010-07-28 MED ORDER — TRIAMCINOLONE ACETONIDE(NASAL) 55 MCG/ACT NA INHA: 2.0000 | Freq: Every day | NASAL | Status: DC

## 2010-07-28 MED ORDER — NAPROXEN 500 MG PO TABS: 500.0000 mg | ORAL_TABLET | Freq: Two times a day (BID) | ORAL | Status: DC

## 2010-07-28 MED ORDER — SIMVASTATIN 40 MG PO TABS: 40.0000 mg | ORAL_TABLET | Freq: Every day | ORAL | Status: DC

## 2010-07-28 MED ORDER — METHYLPREDNISOLONE ACETATE 80 MG/ML IJ SUSP
120.0000 mg | Freq: Once | INTRAMUSCULAR | Status: AC
  Administered 2010-07-28: 120 mg via INTRAMUSCULAR

## 2010-07-28 NOTE — Assessment & Plan Note (Signed)
D/w pt - to cont follow lower chol diet  Lab Results  Component Value Date   LDLCALC 86 06/17/2009

## 2010-07-28 NOTE — Assessment & Plan Note (Signed)
Mild to mod, for predpack, pain med, forearm band, f/u any worsening symptoms

## 2010-07-28 NOTE — Progress Notes (Signed)
Here for wellness and f/u;  Overall doing ok;  Pt denies CP, worsening SOB, DOE, wheezing, orthopnea, PND, worsening LE edema, palpitations, dizziness or syncope.  Pt denies neurological change such as new Headache, facial or extremity weakness.  Pt denies polydipsia, polyuria, or low sugar symptoms. Pt states overall good compliance with treatment and medications, good tolerability, and trying to follow lower cholesterol diet.  Pt denies worsening depressive symptoms, suicidal ideation or panic. No fever, wt loss, night sweats, loss of appetite, or other constitutional symptoms.  Pt states good ability with ADL's, low fall risk, home safety reviewed and adequate, no significant changes in hearing or vision, and occasionally active with exercise several times per wk.  Types at work freqeuntly now with pain to the right lateral epicondyle mild worse again in the past 2 wks after having seen Dr Posey Rea for cortisone shot last visit.  Also with significant seasonal nasal allergy congestion - needs med refill.  Past Medical History  Diagnosis Date  . Allergic rhinitis   . Hyperlipidemia    No past surgical history on file.  reports that she has never smoked. She does not have any smokeless tobacco history on file. She reports that she does not drink alcohol or use illicit drugs. family history includes Arthritis in her mother and Diabetes in her brother. Allergies  Allergen Reactions  . Levofloxacin     REACTION: LEG PAIN/CRAMPS    Review of Systems  Constitutional: Negative for diaphoresis, activity change, appetite change and unexpected weight change.  HENT: Negative for hearing loss, ear pain, facial swelling, mouth sores and neck stiffness.   Eyes: Negative for pain, redness and visual disturbance.  Respiratory: Negative for shortness of breath and wheezing.   Cardiovascular: Negative for chest pain and palpitations.  Gastrointestinal: Negative for diarrhea, blood in stool, abdominal  distention and rectal pain.  Genitourinary: Negative for hematuria, flank pain and decreased urine volume.  Musculoskeletal: Negative for myalgias and joint swelling.  Skin: Negative for color change and wound.  Neurological: Negative for syncope and numbness.  Hematological: Negative for adenopathy.  Psychiatric/Behavioral: Negative for hallucinations, self-injury, decreased concentration and agitation.   Physical Exam  Constitutional: Pt is oriented to person, place, and time. Appears well-developed and well-nourished.  HENT:  Head: Normocephalic and atraumatic.  Right Ear: External ear normal.  Left Ear: External ear normal.  Nose: Nose normal.  Mouth/Throat: Oropharynx is clear and moist.  Eyes: Conjunctivae and EOM are normal. Pupils are equal, round, and reactive to light.  Neck: Normal range of motion. Neck supple. No JVD present. No tracheal deviation present.  Cardiovascular: Normal rate, regular rhythm, normal heart sounds and intact distal pulses.   Pulmonary/Chest: Effort normal and breath sounds normal.  Abdominal: Soft. Bowel sounds are normal. There is no tenderness.  Musculoskeletal: Normal range of motion. Exhibits no edema.  Lymphadenopathy:  Has no cervical adenopathy.  Neurological: Pt is alert and oriented to person, place, and time. Pt has normal reflexes. No cranial nerve deficit.  Skin: Skin is warm and dry. No rash noted.  Psychiatric:  Has  normal mood and affect. Behavior is normal.

## 2010-07-28 NOTE — Assessment & Plan Note (Signed)
Mild to mod - to re-start allergy meds, f/u any worsening symtpoms

## 2010-07-28 NOTE — Patient Instructions (Addendum)
Take all new medications as prescribed  - the naproxen for pain Continue all other medications as before, except stop the meloxicam Please continue to wear the forearm band Call if you would like to be referred to orthopedic Please go to LAB in the Basement for the blood and/or urine tests to be done today Please call the number on the [facility] Card for results in 2-3 days Please followup in 1 yr, or sooner if needed

## 2010-07-28 NOTE — Assessment & Plan Note (Signed)

## 2010-07-29 DIAGNOSIS — E785 Hyperlipidemia, unspecified: Secondary | ICD-10-CM

## 2010-08-25 LAB — HM MAMMOGRAPHY

## 2010-12-22 ENCOUNTER — Ambulatory Visit (INDEPENDENT_AMBULATORY_CARE_PROVIDER_SITE_OTHER): Payer: PRIVATE HEALTH INSURANCE | Admitting: Internal Medicine

## 2010-12-22 ENCOUNTER — Encounter: Payer: Self-pay | Admitting: Internal Medicine

## 2010-12-22 VITALS — BP 90/60 | HR 69 | Temp 98.1°F | Ht 64.0 in | Wt 135.1 lb

## 2010-12-22 DIAGNOSIS — J309 Allergic rhinitis, unspecified: Secondary | ICD-10-CM

## 2010-12-22 DIAGNOSIS — Z0000 Encounter for general adult medical examination without abnormal findings: Secondary | ICD-10-CM

## 2010-12-22 MED ORDER — CYCLOBENZAPRINE HCL 5 MG PO TABS: 5.0000 mg | ORAL_TABLET | Freq: Three times a day (TID) | ORAL | Status: DC | PRN

## 2010-12-22 MED ORDER — METHYLPREDNISOLONE ACETATE 80 MG/ML IJ SUSP
120.0000 mg | Freq: Once | INTRAMUSCULAR | Status: AC
  Administered 2010-12-22: 120 mg via INTRAMUSCULAR

## 2010-12-22 MED ORDER — TRIAMCINOLONE ACETONIDE(NASAL) 55 MCG/ACT NA INHA: 2.0000 | Freq: Every day | NASAL | Status: DC

## 2010-12-22 MED ORDER — CETIRIZINE HCL 10 MG PO TABS: 10.0000 mg | ORAL_TABLET | Freq: Every day | ORAL | Status: DC

## 2010-12-22 NOTE — Patient Instructions (Addendum)
You had the steroid shot today Continue all other medications as before, including re-start of the nasal spray You are given the sample of nasonex and the coupon, which is 2 spray/side each day If you prefer the nasonex as an ongoing prescription instead of the nasacort, or if it is less expensive with your plan, please call and the medication can be changed from the nasacort to the nasonex Please return about March 2013 with Lab testing done 3-5 days before

## 2010-12-22 NOTE — Assessment & Plan Note (Signed)
Mild to mod, for depomedrol IM today, add the nasacort to zyrtec, and to f/u any worsening symptoms or concerns

## 2010-12-22 NOTE — Progress Notes (Signed)
**Note Angela-Identified via Obfuscation**   Subjective:    Patient ID: Angela Webb, female    DOB: [birth year], 40 y.o.   MRN: [id number]  HPI  Here to fu - Does have several wks ongoing nasal allergy symptoms with clear congestion, itch and sneeze, without fever, pain, ST, cough or wheezing, despite current zyrtec.  Has used sons flonase which helps but prefers the prior nasacort but has run out.  Pt denies chest pain, increased sob or doe, wheezing, orthopnea, PND, increased LE swelling, palpitations, dizziness or syncope.  Pt denies new neurological symptoms such as new headache, or facial or extremity weakness or numbness   Pt denies polydipsia, polyuria.   Pt denies fever, wt loss, night sweats, loss of appetite, or other constitutional symptoms Past Medical History  Diagnosis Date  . Allergic rhinitis   . Hyperlipidemia   . ALLERGIC RHINITIS 10/03/2007  . HYPERLIPIDEMIA 08/08/2008   No past surgical history on file.  reports that she has never smoked. She does not have any smokeless tobacco history on file. She reports that she does not drink alcohol or use illicit drugs. family history includes Arthritis in her mother and Diabetes in her brother. Allergies  Allergen Reactions  . Levofloxacin     REACTION: LEG PAIN/CRAMPS   Current Outpatient Prescriptions on File Prior to Visit  Medication Sig Dispense Refill  . Chlorpheniramine-Pseudoeph (SUDAHIST) 12-120 MG TB12 Take by mouth 2 (two) times daily. As needed for congestion       . cyclobenzaprine (FLEXERIL) 5 MG tablet Take 5 mg by mouth 3 (three) times daily as needed.        . naproxen (NAPROSYN) 500 MG tablet Take 1 tablet (500 mg total) by mouth 2 (two) times daily with a meal.  60 tablet  2  . simvastatin (ZOCOR) 40 MG tablet Take 1 tablet (40 mg total) by mouth daily.  90 tablet  3  . Norgestim-Eth Estrad Triphasic (ORTHO TRI-CYCLEN LO) 0.18/0.215/0.25 MG-25 MCG TABS Take by mouth daily.        . predniSONE (DELTASONE) 10 MG tablet Take 10 mg by mouth. Take 40 mg every  day for 3 days, then 20 mg every day for 3 days, then 10 mg every day for 6 days, then stop.       . traMADol (ULTRAM) 50 MG tablet Take 50 mg by mouth. 1-2 tablets by mouth two times a day as needed for pain        No current facility-administered medications on file prior to visit.   Review of Systems All otherwise neg per pt       Objective:   Physical Exam BP 90/60  Pulse 69  Temp(Src) 98.1 F (36.7 C) (Oral)  Ht 5\' 4"  (1.626 m)  Wt 135 lb 2 oz (61.292 kg)  BMI 23.19 kg/m2  SpO2 98%  LMP 12/05/2010 Physical Exam  VS noted Constitutional: Pt appears well-developed and well-nourished.  HENT: Head: Normocephalic.  Right Ear: External ear normal.  Left Ear: External ear normal.  Bilat tm's mild erythema.  Sinus nontender.  Pharynx mild erythema Eyes: Conjunctivae and EOM are normal. Pupils are equal, round, and reactive to light.  Neck: Normal range of motion. Neck supple.  Cardiovascular: Normal rate and regular rhythm.   Pulmonary/Chest: Effort normal and breath sounds normal.     Assessment & Plan:

## 2011-06-29 ENCOUNTER — Encounter: Payer: Self-pay | Admitting: Internal Medicine

## 2011-06-29 ENCOUNTER — Ambulatory Visit (INDEPENDENT_AMBULATORY_CARE_PROVIDER_SITE_OTHER): Payer: PRIVATE HEALTH INSURANCE | Admitting: Internal Medicine

## 2011-06-29 ENCOUNTER — Other Ambulatory Visit (INDEPENDENT_AMBULATORY_CARE_PROVIDER_SITE_OTHER): Payer: PRIVATE HEALTH INSURANCE

## 2011-06-29 VITALS — BP 100/70 | HR 69 | Temp 98.1°F | Ht 64.0 in | Wt 140.1 lb

## 2011-06-29 DIAGNOSIS — Z0000 Encounter for general adult medical examination without abnormal findings: Secondary | ICD-10-CM

## 2011-06-29 DIAGNOSIS — J309 Allergic rhinitis, unspecified: Secondary | ICD-10-CM

## 2011-06-29 DIAGNOSIS — E785 Hyperlipidemia, unspecified: Secondary | ICD-10-CM

## 2011-06-29 DIAGNOSIS — M7711 Lateral epicondylitis, right elbow: Secondary | ICD-10-CM

## 2011-06-29 LAB — URINALYSIS, ROUTINE W REFLEX MICROSCOPIC
Ketones, ur: NEGATIVE
Specific Gravity, Urine: 1.01 (ref 1.000–1.030)
Urine Glucose: NEGATIVE
Urobilinogen, UA: 0.2 (ref 0.0–1.0)
pH: 6.5 (ref 5.0–8.0)

## 2011-06-29 LAB — CBC WITH DIFFERENTIAL/PLATELET
Basophils Relative: 0.7 % (ref 0.0–3.0)
Eosinophils Relative: 2.3 % (ref 0.0–5.0)
HCT: 36.8 % (ref 36.0–46.0)
Hemoglobin: 12.5 g/dL (ref 12.0–15.0)
Lymphocytes Relative: 41.3 % (ref 12.0–46.0)
Lymphs Abs: 2.8 10*3/uL (ref 0.7–4.0)
Monocytes Relative: 7.5 % (ref 3.0–12.0)
Neutro Abs: 3.3 10*3/uL (ref 1.4–7.7)
RBC: 3.98 Mil/uL (ref 3.87–5.11)
WBC: 6.8 10*3/uL (ref 4.5–10.5)

## 2011-06-29 LAB — LIPID PANEL
Cholesterol: 220 mg/dL — ABNORMAL HIGH (ref 0–200)
Total CHOL/HDL Ratio: 2
VLDL: 25 mg/dL (ref 0.0–40.0)

## 2011-06-29 LAB — HEPATIC FUNCTION PANEL
ALT: 11 U/L (ref 0–35)
AST: 15 U/L (ref 0–37)
Alkaline Phosphatase: 42 U/L (ref 39–117)
Bilirubin, Direct: 0.1 mg/dL (ref 0.0–0.3)
Total Bilirubin: 0.5 mg/dL (ref 0.3–1.2)

## 2011-06-29 LAB — BASIC METABOLIC PANEL
BUN: 13 mg/dL (ref 6–23)
Chloride: 103 mEq/L (ref 96–112)
Potassium: 3.8 mEq/L (ref 3.5–5.1)
Sodium: 139 mEq/L (ref 135–145)

## 2011-06-29 MED ORDER — TRIAMCINOLONE ACETONIDE(NASAL) 55 MCG/ACT NA INHA: 2.0000 | Freq: Every day | NASAL | Status: DC

## 2011-06-29 MED ORDER — CETIRIZINE HCL 10 MG PO TABS: 10.0000 mg | ORAL_TABLET | Freq: Every day | ORAL | Status: DC

## 2011-06-29 MED ORDER — SIMVASTATIN 40 MG PO TABS: 40.0000 mg | ORAL_TABLET | Freq: Every day | ORAL | Status: DC

## 2011-06-29 MED ORDER — NAPROXEN 500 MG PO TABS: 500.0000 mg | ORAL_TABLET | Freq: Two times a day (BID) | ORAL | Status: DC

## 2011-06-29 NOTE — Progress Notes (Signed)
**Note Angela-Identified via Obfuscation** Subjective:    Patient ID: Angela Webb, female    DOB: Nov 26, 1970, 41 y.o.   MRN: [id number]  HPI  Here for wellness and f/u;  Overall doing ok;  Pt denies CP, worsening SOB, DOE, wheezing, orthopnea, PND, worsening LE edema, palpitations, dizziness or syncope.  Pt denies neurological change such as new Headache, facial or extremity weakness.  Pt denies polydipsia, polyuria, or low sugar symptoms. Pt states overall good compliance with treatment and medications, good tolerability, and trying to follow lower cholesterol diet.  Pt denies worsening depressive symptoms, suicidal ideation or panic. No fever, wt loss, night sweats, loss of appetite, or other constitutional symptoms.  Pt states good ability with ADL's, low fall risk, home safety reviewed and adequate, no significant changes in hearing or vision, and occasionally active with exercise.  No acute compalints. Did gain 5 lbs over the past yr Past Medical History  Diagnosis Date  . Allergic rhinitis   . Hyperlipidemia   . ALLERGIC RHINITIS 10/03/2007  . HYPERLIPIDEMIA 08/08/2008   No past surgical history on file.  reports that she has never smoked. She does not have any smokeless tobacco history on file. She reports that she does not drink alcohol or use illicit drugs. family history includes Arthritis in her mother and Diabetes in her brother. Allergies  Allergen Reactions  . Levofloxacin     REACTION: LEG PAIN/CRAMPS   Current Outpatient Prescriptions on File Prior to Visit  Medication Sig Dispense Refill  . cyclobenzaprine (FLEXERIL) 5 MG tablet Take 1 tablet (5 mg total) by mouth 3 (three) times daily as needed.  60 tablet  2   Review of Systems Review of Systems  Constitutional: Negative for diaphoresis, activity change, appetite change and unexpected weight change.  HENT: Negative for hearing loss, ear pain, facial swelling, mouth sores and neck stiffness.   Eyes: Negative for pain, redness and visual disturbance.  Respiratory:  Negative for shortness of breath and wheezing.   Cardiovascular: Negative for chest pain and palpitations.  Gastrointestinal: Negative for diarrhea, blood in stool, abdominal distention and rectal pain.  Genitourinary: Negative for hematuria, flank pain and decreased urine volume.  Musculoskeletal: Negative for myalgias and joint swelling.  Skin: Negative for color change and wound.  Neurological: Negative for syncope and numbness.  Hematological: Negative for adenopathy.  Psychiatric/Behavioral: Negative for hallucinations, self-injury, decreased concentration and agitation.      Objective:   Physical Exam BP 100/70  Pulse 69  Temp(Src) 98.1 F (36.7 C) (Oral)  Ht 5\' 4"  (1.626 m)  Wt 140 lb 2 oz (63.56 kg)  BMI 24.05 kg/m2  SpO2 97% Physical Exam  VS noted Constitutional: Pt is oriented to person, place, and time. Appears well-developed and well-nourished.  HENT:  Head: Normocephalic and atraumatic.  Right Ear: External ear normal.  Left Ear: External ear normal.  Nose: Nose normal.  Mouth/Throat: Oropharynx is clear and moist.  Eyes: Conjunctivae and EOM are normal. Pupils are equal, round, and reactive to light.  Neck: Normal range of motion. Neck supple. No JVD present. No tracheal deviation present.  Cardiovascular: Normal rate, regular rhythm, normal heart sounds and intact distal pulses.   Pulmonary/Chest: Effort normal and breath sounds normal.  Abdominal: Soft. Bowel sounds are normal. There is no tenderness.  Musculoskeletal: Normal range of motion. Exhibits no edema.  Lymphadenopathy:  Has no cervical adenopathy.  Neurological: Pt is alert and oriented to person, place, and time. Pt has normal reflexes. No cranial nerve deficit.  Skin: Skin  is warm and dry. No rash noted.  Psychiatric:  Has  normal mood and affect. Behavior is normal. 1+ nervous    Assessment & Plan:

## 2011-06-29 NOTE — Assessment & Plan Note (Signed)

## 2011-06-29 NOTE — Patient Instructions (Signed)
Continue all other medications as before Your refills were sent to the pharmacy today Please go to LAB in the Basement for the blood and/or urine tests to be done today Please call the phone number [phone] (the [employer] System) for results of testing in 2-3 days;  When calling, simply dial the number, and when prompted enter the MRN number above (the Medical Record Number) and the # key, then the message should start. Please return in 1 year for your yearly visit, or sooner if needed, with Lab testing done 3-5 days before

## 2011-06-30 LAB — LDL CHOLESTEROL, DIRECT: Direct LDL: 100.2 mg/dL

## 2011-08-20 ENCOUNTER — Encounter: Payer: Self-pay | Admitting: Internal Medicine

## 2011-08-20 ENCOUNTER — Ambulatory Visit (INDEPENDENT_AMBULATORY_CARE_PROVIDER_SITE_OTHER): Payer: PRIVATE HEALTH INSURANCE | Admitting: Internal Medicine

## 2011-08-20 VITALS — BP 90/60 | HR 67 | Temp 98.1°F | Ht 63.5 in | Wt 137.4 lb

## 2011-08-20 DIAGNOSIS — J309 Allergic rhinitis, unspecified: Secondary | ICD-10-CM

## 2011-08-20 DIAGNOSIS — J069 Acute upper respiratory infection, unspecified: Secondary | ICD-10-CM

## 2011-08-20 DIAGNOSIS — K219 Gastro-esophageal reflux disease without esophagitis: Secondary | ICD-10-CM

## 2011-08-20 MED ORDER — AZITHROMYCIN 250 MG PO TABS: ORAL_TABLET | ORAL | Status: AC

## 2011-08-20 MED ORDER — ESOMEPRAZOLE MAGNESIUM 40 MG PO CPDR: 40.0000 mg | DELAYED_RELEASE_CAPSULE | Freq: Every day | ORAL | Status: DC

## 2011-08-20 MED ORDER — METHYLPREDNISOLONE ACETATE 80 MG/ML IJ SUSP
120.0000 mg | Freq: Once | INTRAMUSCULAR | Status: AC
  Administered 2011-08-20: 120 mg via INTRAMUSCULAR

## 2011-08-20 NOTE — Patient Instructions (Addendum)
You had the steroid shot today Take all new medications as prescribed - the antibiotic, and the Nexium Continue all other medications as before Please have the pharmacy call with any refills you may need. You can also use the Sudafed as you have been doing You can also take Mucinex (or it's generic off brand) for congestion Please limit your use of Afrin

## 2011-08-23 ENCOUNTER — Encounter: Payer: Self-pay | Admitting: Internal Medicine

## 2011-08-23 NOTE — Assessment & Plan Note (Signed)
Mild to mod, for antibx course,  to f/u any worsening symptoms or concerns 

## 2011-08-23 NOTE — Assessment & Plan Note (Addendum)
Mild to mod, for depomedrol IM and med re-start,  to f/u any worsening symptoms or concerns\

## 2011-08-23 NOTE — Assessment & Plan Note (Signed)
Mild to mod, for PPI,  to f/u any worsening symptoms or concerns 

## 2011-08-23 NOTE — Progress Notes (Signed)
**Note Angela-Identified via Obfuscation** Subjective:    Patient ID: Angela Webb, female    DOB: [birth year], 41 y.o.   MRN: [id number]  HPI   Here with 3 days acute onset fever, , Severe ST, general weakness and malaise, but little to no cough and Pt denies chest pain, increased sob or doe, wheezing, orthopnea, PND, increased LE swelling, palpitations, dizziness or syncope.  Does have several wks ongoing nasal allergy symptoms with clear congestion, itch and sneeze, without fever, pain,  cough or wheezing.  Has had also mild worsening reflux, without dysphagia, abd pain, n/v, bowel change or blood. Overall good compliance with treatment, and good medicine tolerability, including the statin.  Has also been using afrin otc freq recently to help breathe and sleep at night. Not using her allergy med recently.   Past Medical History  Diagnosis Date  . Allergic rhinitis   . Hyperlipidemia   . ALLERGIC RHINITIS 10/03/2007  . HYPERLIPIDEMIA 08/08/2008   No past surgical history on file.  reports that she has never smoked. She does not have any smokeless tobacco history on file. She reports that she does not drink alcohol or use illicit drugs. family history includes Arthritis in her mother and Diabetes in her brother. Allergies  Allergen Reactions  . Levofloxacin     REACTION: LEG PAIN/CRAMPS   Current Outpatient Prescriptions on File Prior to Visit  Medication Sig Dispense Refill  . cetirizine (ZYRTEC) 10 MG tablet Take 1 tablet (10 mg total) by mouth daily. As needed For allergies  90 tablet  3  . cyclobenzaprine (FLEXERIL) 5 MG tablet Take 1 tablet (5 mg total) by mouth 3 (three) times daily as needed.  60 tablet  2  . naproxen (NAPROSYN) 500 MG tablet Take 1 tablet (500 mg total) by mouth 2 (two) times daily with a meal.  60 tablet  2  . simvastatin (ZOCOR) 40 MG tablet Take 1 tablet (40 mg total) by mouth daily.  90 tablet  3  . triamcinolone (NASACORT AQ) 55 MCG/ACT nasal inhaler Place 2 sprays into the nose daily.  1 Inhaler  3  .  esomeprazole (NEXIUM) 40 MG capsule Take 1 capsule (40 mg total) by mouth daily.  90 capsule  3   Review of Systems Review of Systems  Constitutional: Negative for diaphoresis and unexpected weight change.  Respiratory: Negative for choking and stridor.   Gastrointestinal: Negative for vomiting and blood in stool.  Genitourinary: Negative for hematuria and decreased urine volume.  Musculoskeletal: Negative for gait problem.  Skin: Negative for color change and wound.  Neurological: Negative for tremors and numbness.  Psychiatric/Behavioral: Negative for decreased concentration. The patient is not hyperactive.       Objective:   Physical Exam BP 90/60  Pulse 67  Temp(Src) 98.1 F (36.7 C) (Oral)  Ht 5' 3.5" (1.613 m)  Wt 137 lb 6 oz (62.313 kg)  BMI 23.95 kg/m2  SpO2 98% Physical Exam  VS noted, mild ill Constitutional: Pt appears well-developed and well-nourished.  HENT: Head: Normocephalic.  Right Ear: External ear normal.  Left Ear: External ear normal.  Bilat tm's mild erythema.  Sinus nontender.  Pharynx mild erythema Eyes: Conjunctivae and EOM are normal. Pupils are equal, round, and reactive to light.  Neck: Normal range of motion. Neck supple.  Cardiovascular: Normal rate and regular rhythm.   Pulmonary/Chest: Effort normal and breath sounds normal.  Abd:  Soft, NT, non-distended, + BS Neurological: Pt is alert. Skin: Skin is warm. No erythema.  Psychiatric: Pt  behavior is normal. Thought content normal.     Assessment & Plan:

## 2011-11-16 ENCOUNTER — Other Ambulatory Visit: Payer: Self-pay | Admitting: Internal Medicine

## 2011-11-29 ENCOUNTER — Other Ambulatory Visit: Payer: Self-pay | Admitting: Internal Medicine

## 2011-12-31 ENCOUNTER — Other Ambulatory Visit: Payer: Self-pay | Admitting: Internal Medicine

## 2012-06-28 ENCOUNTER — Telehealth: Payer: Self-pay

## 2012-06-28 DIAGNOSIS — Z0000 Encounter for general adult medical examination without abnormal findings: Secondary | ICD-10-CM

## 2012-06-28 NOTE — Telephone Encounter (Signed)
Lab order entered for cpx

## 2012-07-07 ENCOUNTER — Encounter: Payer: Self-pay | Admitting: Internal Medicine

## 2012-07-07 ENCOUNTER — Ambulatory Visit (INDEPENDENT_AMBULATORY_CARE_PROVIDER_SITE_OTHER)
Admission: RE | Admit: 2012-07-07 | Discharge: 2012-07-07 | Disposition: A | Discharge status: Home / Self Care | Payer: PRIVATE HEALTH INSURANCE | Source: Ambulatory Visit | Attending: Internal Medicine | Admitting: Internal Medicine

## 2012-07-07 ENCOUNTER — Ambulatory Visit (INDEPENDENT_AMBULATORY_CARE_PROVIDER_SITE_OTHER): Payer: PRIVATE HEALTH INSURANCE | Admitting: Internal Medicine

## 2012-07-07 VITALS — BP 102/70 | HR 63 | Temp 97.8°F | Ht 63.5 in | Wt 147.0 lb

## 2012-07-07 DIAGNOSIS — M25569 Pain in unspecified knee: Secondary | ICD-10-CM

## 2012-07-07 DIAGNOSIS — IMO0001 Reserved for inherently not codable concepts without codable children: Secondary | ICD-10-CM

## 2012-07-07 MED ORDER — HYDROCODONE-ACETAMINOPHEN 5-325 MG PO TABS: 1.0000 | ORAL_TABLET | Freq: Four times a day (QID) | ORAL | Status: DC | PRN

## 2012-07-07 NOTE — Patient Instructions (Signed)
Leg Cramps Leg cramps that occur during exercise can be caused by poor circulation or dehydration. However, muscle cramps that occur at rest or during the night are usually not due to any serious medical problem. Heat cramps may cause muscle spasms during hot weather.  CAUSES There is no clear cause for muscle cramps. However, dehydration may be a factor for those who do not drink enough fluids and those who exercise in the heat. Imbalances in the level of sodium, potassium, calcium or magnesium in the muscle tissue may also be a factor. Some medications, such as water pills (diuretics), may cause loss of chemicals that the body needs (like sodium and potassium) and cause muscle cramps. TREATMENT   Make sure your diet has enough fluids and essential minerals for the muscle to work normally.  Avoid strenuous exercise for several days if you have been having frequent leg cramps.  Stretch and massage the cramped muscle for several minutes.  Some medicines may be helpful in some patients with night cramps. Only take over-the-counter or prescription medicines as directed by your caregiver. SEEK IMMEDIATE MEDICAL CARE IF:   Your leg cramps become worse.  Your foot becomes cold, numb, or blue. Document Released: 05/28/2004 Document Revised: 07/13/2011 Document Reviewed: 05/15/2008 Exit[employer] Patient Information 2013 [employer].  

## 2012-07-11 ENCOUNTER — Telehealth: Payer: Self-pay | Admitting: Internal Medicine

## 2012-07-11 ENCOUNTER — Encounter: Payer: Self-pay | Admitting: Internal Medicine

## 2012-07-11 NOTE — Progress Notes (Signed)
**Note Angela-Identified via Obfuscation** Subjective:    Patient ID: Angela Webb, female    DOB: May 21, 1970, 42 y.o.   MRN: [id number]  HPI  Pt presents to the clinic today with c/o bilateral lower leg pain after working out. She does not feel like this is muscle strain. It almost feels like her bones are aching, kind of like shin splints. This has been happening for about 3 years. She has never had it evaluated. She has taken Naprosyn and Flexeril without relief. She did take one of her husbands Norco and that is the only thing that takes the pain away. She denies having an injury to the area.  Review of Systems      Past Medical History  Diagnosis Date  . Allergic rhinitis   . Hyperlipidemia   . ALLERGIC RHINITIS 10/03/2007  . HYPERLIPIDEMIA 08/08/2008    Current Outpatient Prescriptions  Medication Sig Dispense Refill  . cetirizine (ZYRTEC) 10 MG tablet Take 1 tablet (10 mg total) by mouth daily. As needed For allergies  90 tablet  3  . cyclobenzaprine (FLEXERIL) 5 MG tablet TAKE ONE TABLET BY MOUTH THREE TIMES DAILY AS NEEDED  60 tablet  1  . esomeprazole (NEXIUM) 40 MG capsule Take 1 capsule (40 mg total) by mouth daily.  90 capsule  3  . naproxen (NAPROSYN) 500 MG tablet TAKE 1 TABLET BY MOUTH TWICE DAILY WITH A MEAL  60 tablet  0  . simvastatin (ZOCOR) 40 MG tablet Take 1 tablet (40 mg total) by mouth daily.  90 tablet  3  . triamcinolone (NASACORT AQ) 55 MCG/ACT nasal inhaler Place 2 sprays into the nose daily.  1 Inhaler  3  . HYDROcodone-acetaminophen (NORCO/VICODIN) 5-325 MG per tablet Take 1 tablet by mouth every 6 (six) hours as needed for pain.  10 tablet  0   No current facility-administered medications for this visit.    Allergies  Allergen Reactions  . Levofloxacin     REACTION: LEG PAIN/CRAMPS    Family History  Problem Relation Age of Onset  . Arthritis Mother   . Diabetes Brother     type I, deceased    History   Social History  . Marital Status: Married    Spouse Name: N/A    Number of  Children: 1  . Years of Education: N/A   Occupational History  . customer service    Social History Main Topics  . Smoking status: Never Smoker   . Smokeless tobacco: Not on file  . Alcohol Use: No  . Drug Use: No  . Sexually Active: Not on file   Other Topics Concern  . Not on file   Social History Narrative  . No narrative on file     Constitutional: Denies fever, malaise, fatigue, headache or abrupt weight changes.  Musculoskeletal: Pt reports pain in her bilateral lower extremities. Denies decrease in range of motion, difficulty with gait, muscle pain or joint pain and swelling.  Neurological: Denies dizziness, difficulty with memory, difficulty with speech or problems with balance and coordination.   No other specific complaints in a complete review of systems (except as listed in HPI above).  Objective:   Physical Exam   BP 102/70  Pulse 63  Temp(Src) 97.8 F (36.6 C) (Oral)  Ht 5' 3.5" (1.613 m)  Wt 147 lb (66.679 kg)  BMI 25.63 kg/m2  SpO2 98%  LMP 06/23/2012 Wt Readings from Last 3 Encounters:  07/07/12 147 lb (66.679 kg)  08/20/11 137 lb 6 oz (62.313  kg)  06/29/11 140 lb 2 oz (63.56 kg)    General: Appears her stated age, well developed, well nourished in NAD. Cardiovascular: Normal rate and rhythm. S1,S2 noted.  No murmur, rubs or gallops noted. No JVD or BLE edema. No carotid bruits noted. Pulmonary/Chest: Normal effort and positive vesicular breath sounds. No respiratory distress. No wheezes, rales or ronchi noted. . Musculoskeletal: Tenderness noted along the bilateral tibias. Normal range of motion. No signs of joint swelling. No difficulty with gait.  Neurological: Alert and oriented. Cranial nerves II-XII intact. Coordination normal. +DTRs bilaterally.       Assessment & Plan:   Bilateral lower extremity pain, new onset with additional workup required:  Will obtain plain films of bilateral tib/fibs eRx for Norco # 10, no refills by me I  would avoid doing any strenuous activity until we get the results of the xrays.  Pt is on statin, will r/o myositis by getting CK, HFP  RTC if pain suddenly gets worse

## 2012-07-11 NOTE — Telephone Encounter (Signed)
Pt informed of xray results

## 2012-07-11 NOTE — Telephone Encounter (Signed)
Patient is requesting a call back with her x-ray results.

## 2012-08-11 ENCOUNTER — Other Ambulatory Visit: Payer: Self-pay | Admitting: Internal Medicine

## 2012-08-22 ENCOUNTER — Ambulatory Visit (INDEPENDENT_AMBULATORY_CARE_PROVIDER_SITE_OTHER): Payer: PRIVATE HEALTH INSURANCE

## 2012-08-22 ENCOUNTER — Ambulatory Visit (INDEPENDENT_AMBULATORY_CARE_PROVIDER_SITE_OTHER): Payer: PRIVATE HEALTH INSURANCE | Admitting: Internal Medicine

## 2012-08-22 ENCOUNTER — Encounter: Payer: Self-pay | Admitting: *Deleted

## 2012-08-22 ENCOUNTER — Encounter: Payer: Self-pay | Admitting: Internal Medicine

## 2012-08-22 VITALS — BP 100/72 | HR 62 | Temp 97.9°F | Ht 63.0 in | Wt 144.0 lb

## 2012-08-22 DIAGNOSIS — K219 Gastro-esophageal reflux disease without esophagitis: Secondary | ICD-10-CM

## 2012-08-22 DIAGNOSIS — Z0000 Encounter for general adult medical examination without abnormal findings: Secondary | ICD-10-CM

## 2012-08-22 DIAGNOSIS — M545 Low back pain: Secondary | ICD-10-CM

## 2012-08-22 LAB — CBC WITH DIFFERENTIAL/PLATELET
Basophils Absolute: 0 10*3/uL (ref 0.0–0.1)
Basophils Relative: 0.4 % (ref 0.0–3.0)
Eosinophils Absolute: 0.1 10*3/uL (ref 0.0–0.7)
Lymphocytes Relative: 33.9 % (ref 12.0–46.0)
MCHC: 34.4 g/dL (ref 30.0–36.0)
Neutrophils Relative %: 59.2 % (ref 43.0–77.0)
RBC: 3.92 Mil/uL (ref 3.87–5.11)

## 2012-08-22 MED ORDER — SIMVASTATIN 40 MG PO TABS: 40.0000 mg | ORAL_TABLET | Freq: Every day | ORAL | Status: DC

## 2012-08-22 MED ORDER — PANTOPRAZOLE SODIUM 40 MG PO TBEC: 40.0000 mg | DELAYED_RELEASE_TABLET | Freq: Every day | ORAL | Status: DC

## 2012-08-22 MED ORDER — CYCLOBENZAPRINE HCL 5 MG PO TABS: 5.0000 mg | ORAL_TABLET | Freq: Every day | ORAL | Status: DC

## 2012-08-22 NOTE — Progress Notes (Signed)
**Note Angela-Identified via Obfuscation** Subjective:    Patient ID: Angela Webb, female    DOB: 02/18/1971, 42 y.o.   MRN: [id number]  HPI  Here for wellness and f/u;  Overall doing ok;  Pt denies CP, worsening SOB, DOE, wheezing, orthopnea, PND, worsening LE edema, palpitations, dizziness or syncope.  Pt denies neurological change such as new headache, facial or extremity weakness.  Pt denies polydipsia, polyuria, or low sugar symptoms. Pt states overall good compliance with treatment and medications, good tolerability, and has been trying to follow lower cholesterol diet.  Pt denies worsening depressive symptoms, suicidal ideation or panic. No fever, night sweats, wt loss, loss of appetite, or other constitutional symptoms.  Pt states good ability with ADL's, has low fall risk, home safety reviewed and adequate, no other significant changes in hearing or vision, and only occasionally active with exercise.  Ask for change nexium to protonix due to formulary.  Had a left temple area migraine last wk, but rare only the past few months.  Has a sharp pain to mid parasternal area pleuritic for about 5 min last wk, no recurrence.  Pt continues to have recurring LBP,but no bowel or bladder change, fever, wt loss,  worsening LE pain/numbness/weakness, gait change or falls, but increased pain to bilat hips and legs recurring as well in recent weeks. Past Medical History  Diagnosis Date  . Allergic rhinitis   . Hyperlipidemia   . ALLERGIC RHINITIS 10/03/2007  . HYPERLIPIDEMIA 08/08/2008   No past surgical history on file.  reports that she has never smoked. She does not have any smokeless tobacco history on file. She reports that she does not drink alcohol or use illicit drugs. family history includes Arthritis in her mother and Diabetes in her brother. Allergies  Allergen Reactions  . Levofloxacin     REACTION: LEG PAIN/CRAMPS   Current Outpatient Prescriptions on File Prior to Visit  Medication Sig Dispense Refill  . cetirizine (ZYRTEC) 10 MG  tablet TAKE 1 TABLET BY MOUTH DAILY AS NEEDED FOR ALLERGIES  90 tablet  3  . HYDROcodone-acetaminophen (NORCO/VICODIN) 5-325 MG per tablet Take 1 tablet by mouth every 6 (six) hours as needed for pain.  10 tablet  0  . naproxen (NAPROSYN) 500 MG tablet TAKE 1 TABLET BY MOUTH TWICE DAILY WITH A MEAL  60 tablet  0  . triamcinolone (NASACORT AQ) 55 MCG/ACT nasal inhaler Place 2 sprays into the nose daily.  1 Inhaler  3  . esomeprazole (NEXIUM) 40 MG capsule Take 1 capsule (40 mg total) by mouth daily.  90 capsule  3   No current facility-administered medications on file prior to visit.   Review of Systems Constitutional: Negative for diaphoresis, activity change, appetite change or unexpected weight change.  HENT: Negative for hearing loss, ear pain, facial swelling, mouth sores and neck stiffness.   Eyes: Negative for pain, redness and visual disturbance.  Respiratory: Negative for shortness of breath and wheezing.   Cardiovascular: Negative for chest pain and palpitations.  Gastrointestinal: Negative for diarrhea, blood in stool, abdominal distention or other pain Genitourinary: Negative for hematuria, flank pain or change in urine volume.  Musculoskeletal: Negative for myalgias and joint swelling.  Skin: Negative for color change and wound.  Neurological: Negative for syncope and numbness. other than noted Hematological: Negative for adenopathy.  Psychiatric/Behavioral: Negative for hallucinations, self-injury, decreased concentration and agitation.      Objective:   Physical Exam BP 100/72  Pulse 62  Temp(Src) 97.9 F (36.6 C) (Oral)  Ht  5\' 3"  (1.6 m)  Wt 144 lb (65.318 kg)  BMI 25.51 kg/m2  SpO2 98% VS noted,  Constitutional: Pt is oriented to person, place, and time. Appears well-developed and well-nourished.  Head: Normocephalic and atraumatic.  Right Ear: External ear normal.  Left Ear: External ear normal.  Nose: Nose normal.  Mouth/Throat: Oropharynx is clear and moist.   Eyes: Conjunctivae and EOM are normal. Pupils are equal, round, and reactive to light.  Neck: Normal range of motion. Neck supple. No JVD present. No tracheal deviation present.  Cardiovascular: Normal rate, regular rhythm, normal heart sounds and intact distal pulses.   Pulmonary/Chest: Effort normal and breath sounds normal.  Abdominal: Soft. Bowel sounds are normal. There is no tenderness. No HSM  Musculoskeletal: Normal range of motion. Exhibits no edema.  Lymphadenopathy:  Has no cervical adenopathy.  Neurological: Pt is alert and oriented to person, place, and time. Pt has normal reflexes. No cranial nerve deficit. Motor/dtr/gait intact Skin: Skin is warm and dry. No rash noted.  Psychiatric:  Has  normal mood and affect. Behavior is normal.     Assessment & Plan:

## 2012-08-22 NOTE — Assessment & Plan Note (Signed)
For ortho referral per pt request

## 2012-08-22 NOTE — Assessment & Plan Note (Signed)
Ok to change nexium to protonix asd

## 2012-08-22 NOTE — Assessment & Plan Note (Signed)

## 2012-08-22 NOTE — Patient Instructions (Addendum)
OK to change the nexium to pantoprazole Please continue all other medications as before, and refills have been done if requested. You are otherwise up to date with prevention measures today. Please continue your efforts at being more active, low cholesterol diet, and weight control. You will be contacted regarding the referral for: orthopedic Please go to the LAB in the Basement (turn left off the elevator) for the tests to be done today You will be contacted by phone if any changes need to be made immediately.  Otherwise, you will receive a letter about your results with an explanation, but please check with MyChart first. Thank you for enrolling in MyChart. Please follow the instructions below to securely access your online medical record. MyChart allows you to send messages to your doctor, view your test results, renew your prescriptions, schedule appointments, and more To Log into My Chart online, please go by [facility] or [employer] to [email or web address], or download the MyChart App from the [employer] .  Your Username is: [email or web address] ([email or web address]) Please return in 1 year for your yearly visit, or sooner if needed, with Lab testing done 3-5 days before

## 2012-08-23 LAB — BASIC METABOLIC PANEL
BUN: 12 mg/dL (ref 6–23)
CO2: 25 mEq/L (ref 19–32)
Chloride: 104 mEq/L (ref 96–112)
Creatinine, Ser: 0.9 mg/dL (ref 0.4–1.2)
Glucose, Bld: 92 mg/dL (ref 70–99)

## 2012-08-23 LAB — HEPATIC FUNCTION PANEL
ALT: 14 U/L (ref 0–35)
Albumin: 3.8 g/dL (ref 3.5–5.2)
Bilirubin, Direct: 0.1 mg/dL (ref 0.0–0.3)
Total Protein: 6.7 g/dL (ref 6.0–8.3)

## 2012-08-23 LAB — LIPID PANEL
Cholesterol: 219 mg/dL — ABNORMAL HIGH (ref 0–200)
Total CHOL/HDL Ratio: 3

## 2012-08-31 ENCOUNTER — Telehealth: Payer: Self-pay | Admitting: *Deleted

## 2012-08-31 MED ORDER — PANTOPRAZOLE SODIUM 40 MG PO TBEC: 40.0000 mg | DELAYED_RELEASE_TABLET | Freq: Every day | ORAL | Status: DC

## 2012-08-31 NOTE — Telephone Encounter (Signed)
R'cd fax from [facility] for PA of generic Protonix-per insurance, pt cannot receive 90 day supply through retail pharmacy. Rx for 30 day supply must be sent. Rx resent to [facility] for 30 day supply.

## 2012-09-28 ENCOUNTER — Encounter: Payer: Self-pay | Admitting: Internal Medicine

## 2012-09-28 ENCOUNTER — Ambulatory Visit (INDEPENDENT_AMBULATORY_CARE_PROVIDER_SITE_OTHER): Payer: PRIVATE HEALTH INSURANCE | Admitting: Internal Medicine

## 2012-09-28 VITALS — BP 110/70 | HR 64 | Temp 97.7°F | Ht 63.0 in | Wt 141.0 lb

## 2012-09-28 DIAGNOSIS — J309 Allergic rhinitis, unspecified: Secondary | ICD-10-CM

## 2012-09-28 MED ORDER — METHYLPREDNISOLONE ACETATE 80 MG/ML IJ SUSP
80.0000 mg | Freq: Once | INTRAMUSCULAR | Status: AC
  Administered 2012-09-28: 80 mg via INTRAMUSCULAR

## 2012-09-28 MED ORDER — FLUTICASONE PROPIONATE 50 MCG/ACT NA SUSP: 2.0000 | Freq: Every day | NASAL | Status: DC

## 2012-09-28 NOTE — Assessment & Plan Note (Signed)
With situational flare eye and nasal symtpoms, for depomedrol IM, flonase asd, allegra otc prn,  to f/u any worsening symptoms or concerns

## 2012-09-28 NOTE — Patient Instructions (Signed)
You had the steroid shot today Please take all new medication as prescribed - the generic flonase Please continue all other medications as before, including the zyrtec Remember, you can also now use OTC Nasacort for a few days in the future if you know you are going to be outside in the springtime, like hiking or a wedding

## 2012-09-28 NOTE — Progress Notes (Signed)
**Note Angela-Identified via Obfuscation** Subjective:    Patient ID: Angela Webb, female    DOB: November 13, 1970, 42 y.o.   MRN: [id number]  HPI  Here to f/u with acute - Does have several days severe ongoing nasal allergy symptoms with clearish congestion, itch and sneezing, without fever, pain, ST, cough, swelling or wheezing, started after spending 3 hrs at an outdoor wedding.   Pt denies fever, wt loss, night sweats, loss of appetite, or other constitutional symptoms  Pt denies new neurological symptoms such as new headache, or facial or extremity weakness or numbness   Pt denies polydipsia, polyuria., Pt denies chest pain, increased sob or doe, wheezing, orthopnea, PND, increased LE swelling, palpitations, dizziness or syncope. No rash, tongue swelling  Past Medical History  Diagnosis Date  . Allergic rhinitis   . Hyperlipidemia   . ALLERGIC RHINITIS 10/03/2007  . HYPERLIPIDEMIA 08/08/2008   No past surgical history on file.  reports that she has never smoked. She does not have any smokeless tobacco history on file. She reports that she does not drink alcohol or use illicit drugs. family history includes Arthritis in her mother and Diabetes in her brother. Allergies  Allergen Reactions  . Levofloxacin     REACTION: LEG PAIN/CRAMPS   Current Outpatient Prescriptions on File Prior to Visit  Medication Sig Dispense Refill  . cetirizine (ZYRTEC) 10 MG tablet TAKE 1 TABLET BY MOUTH DAILY AS NEEDED FOR ALLERGIES  90 tablet  3  . cyclobenzaprine (FLEXERIL) 5 MG tablet Take 1 tablet (5 mg total) by mouth daily.  60 tablet  6  . HYDROcodone-acetaminophen (NORCO/VICODIN) 5-325 MG per tablet Take 1 tablet by mouth every 6 (six) hours as needed for pain.  10 tablet  0  . naproxen (NAPROSYN) 500 MG tablet TAKE 1 TABLET BY MOUTH TWICE DAILY WITH A MEAL  60 tablet  0  . pantoprazole (PROTONIX) 40 MG tablet Take 1 tablet (40 mg total) by mouth daily.  30 tablet  11  . simvastatin (ZOCOR) 40 MG tablet Take 1 tablet (40 mg total) by mouth daily.  30  tablet  11  . esomeprazole (NEXIUM) 40 MG capsule Take 1 capsule (40 mg total) by mouth daily.  90 capsule  3   No current facility-administered medications on file prior to visit.   Review of Systems  Constitutional: Negative for unexpected weight change, or unusual diaphoresis  HENT: Negative for tinnitus.   Eyes: Negative for photophobia and visual disturbance.  Respiratory: Negative for choking and stridor.   Gastrointestinal: Negative for vomiting and blood in stool.  Genitourinary: Negative for hematuria and decreased urine volume.  Musculoskeletal: Negative for acute joint swelling Skin: Negative for color change and wound.  Neurological: Negative for tremors and numbness other than noted  Psychiatric/Behavioral: Negative for decreased concentration or  hyperactivity.       Objective:   Physical Exam BP 110/70  Pulse 64  Temp(Src) 97.7 F (36.5 C) (Oral)  Ht 5\' 3"  (1.6 m)  Wt 141 lb (63.957 kg)  BMI 24.98 kg/m2  SpO2 97% VS noted,  Constitutional: Pt appears well-developed and well-nourished.  HENT: Head: NCAT.  Right Ear: External ear normal.  Left Ear: External ear normal.  Bilat tm's with mild erythema.  Max sinus areas mild tender.  Pharynx with mild erythema, no exudate Eyes: Conjunctivae and EOM are normal. Pupils are equal, round, and reactive to light.  Neck: Normal range of motion. Neck supple.  Cardiovascular: Normal rate and regular rhythm.   Pulmonary/Chest: Effort normal  and breath sounds normal.  - no rales or wheezing Neurological: Pt is alert. Not confused , motor 5/5 Skin: Skin is warm. No erythema.  Psychiatric: Pt behavior is normal. Thought content normal.     Assessment & Plan:

## 2013-03-09 ENCOUNTER — Other Ambulatory Visit: Payer: Self-pay

## 2013-08-24 ENCOUNTER — Ambulatory Visit (INDEPENDENT_AMBULATORY_CARE_PROVIDER_SITE_OTHER): Payer: PRIVATE HEALTH INSURANCE | Admitting: Internal Medicine

## 2013-08-24 ENCOUNTER — Encounter: Payer: Self-pay | Admitting: Internal Medicine

## 2013-08-24 VITALS — BP 108/72 | HR 62 | Temp 97.6°F | Ht 63.0 in | Wt 154.1 lb

## 2013-08-24 DIAGNOSIS — Z0000 Encounter for general adult medical examination without abnormal findings: Secondary | ICD-10-CM

## 2013-08-24 DIAGNOSIS — J309 Allergic rhinitis, unspecified: Secondary | ICD-10-CM

## 2013-08-24 MED ORDER — CELECOXIB 200 MG PO CAPS: 200.0000 mg | ORAL_CAPSULE | Freq: Every day | ORAL | Status: DC

## 2013-08-24 MED ORDER — METHYLPREDNISOLONE ACETATE 80 MG/ML IJ SUSP
80.0000 mg | Freq: Once | INTRAMUSCULAR | Status: AC
  Administered 2013-08-24: 80 mg via INTRAMUSCULAR

## 2013-08-24 NOTE — Progress Notes (Signed)
Pre visit review using our clinic review tool, if applicable. No additional management support is needed unless otherwise documented below in the visit note. 

## 2013-08-24 NOTE — Progress Notes (Signed)
**Note Angela-Identified via Obfuscation** Subjective:    Patient ID: Angela Webb, female    DOB: 08/14/1970, 43 y.o.   MRN: [id number]  HPI  Here for wellness and f/u;  Overall doing ok;  Pt denies CP, worsening SOB, DOE, wheezing, orthopnea, PND, worsening LE edema, palpitations, dizziness or syncope.  Pt denies neurological change such as new headache, facial or extremity weakness.  Pt denies polydipsia, polyuria, or low sugar symptoms. Pt states overall good compliance with treatment and medications, good tolerability, and has been trying to follow lower cholesterol diet.  Pt denies worsening depressive symptoms, suicidal ideation or panic. No fever, night sweats, wt loss, loss of appetite, or other constitutional symptoms.  Pt states good ability with ADL's, has low fall risk, home safety reviewed and adequate, no other significant changes in hearing or vision, and only occasionally active with exercise.  Pt continues to have recurring LBP without change in severity, bowel or bladder change, fever, wt loss,  worsening LE pain/numbness/weakness, gait change or falls. TENS units and celebrex have helped. Last MRI 2014.   Due for f/u pap today - has gyn appt Past Medical History  Diagnosis Date  . Allergic rhinitis   . Hyperlipidemia   . ALLERGIC RHINITIS 10/03/2007  . HYPERLIPIDEMIA 08/08/2008   History reviewed. No pertinent past surgical history.  reports that she has never smoked. She does not have any smokeless tobacco history on file. She reports that she does not drink alcohol or use illicit drugs. family history includes Arthritis in her mother; Diabetes in her brother. Allergies  Allergen Reactions  . Levofloxacin     REACTION: LEG PAIN/CRAMPS   Current Outpatient Prescriptions on File Prior to Visit  Medication Sig Dispense Refill  . cetirizine (ZYRTEC) 10 MG tablet TAKE 1 TABLET BY MOUTH DAILY AS NEEDED FOR ALLERGIES  90 tablet  3  . cyclobenzaprine (FLEXERIL) 5 MG tablet Take 1 tablet (5 mg total) by mouth daily.  60  tablet  6  . fluticasone (FLONASE) 50 MCG/ACT nasal spray Place 2 sprays into the nose daily.  16 g  2  . HYDROcodone-acetaminophen (NORCO/VICODIN) 5-325 MG per tablet Take 1 tablet by mouth every 6 (six) hours as needed for pain.  10 tablet  0  . pantoprazole (PROTONIX) 40 MG tablet Take 1 tablet (40 mg total) by mouth daily.  30 tablet  11  . simvastatin (ZOCOR) 40 MG tablet Take 1 tablet (40 mg total) by mouth daily.  30 tablet  11  . esomeprazole (NEXIUM) 40 MG capsule Take 1 capsule (40 mg total) by mouth daily.  90 capsule  3   No current facility-administered medications on file prior to visit.   Review of Systems Constitutional: Negative for diaphoresis, activity change, appetite change or unexpected weight change.  HENT: Negative for hearing loss, ear pain, facial swelling, mouth sores and neck stiffness.   Eyes: Negative for pain, redness and visual disturbance.  Respiratory: Negative for shortness of breath and wheezing.   Cardiovascular: Negative for chest pain and palpitations.  Gastrointestinal: Negative for diarrhea, blood in stool, abdominal distention or other pain Genitourinary: Negative for hematuria, flank pain or change in urine volume.  Musculoskeletal: Negative for myalgias and joint swelling.  Skin: Negative for color change and wound.  Neurological: Negative for syncope and numbness. other than noted Hematological: Negative for adenopathy.  Psychiatric/Behavioral: Negative for hallucinations, self-injury, decreased concentration and agitation.      Objective:   Physical Exam BP 108/72  Pulse 62  Temp(Src) 97.6  F (36.4 C) (Oral)  Ht 5\' 3"  (1.6 m)  Wt 154 lb 2 oz (69.911 kg)  BMI 27.31 kg/m2  SpO2 97% VS noted,  Constitutional: Pt is oriented to person, place, and time. Appears well-developed and well-nourished.  Head: Normocephalic and atraumatic.  Right Ear: External ear normal.  Left Ear: External ear normal.  Nose: Nose normal.  Mouth/Throat:  Oropharynx is clear and moist.  Eyes: Conjunctivae and EOM are normal. Pupils are equal, round, and reactive to light.  Neck: Normal range of motion. Neck supple. No JVD present. No tracheal deviation present.  Cardiovascular: Normal rate, regular rhythm, normal heart sounds and intact distal pulses.   Pulmonary/Chest: Effort normal and breath sounds normal.  Abdominal: Soft. Bowel sounds are normal. There is no tenderness. No HSM  Musculoskeletal: Normal range of motion. Exhibits no edema.  Lymphadenopathy:  Has no cervical adenopathy.  Neurological: Pt is alert and oriented to person, place, and time. Pt has normal reflexes. No cranial nerve deficit.  Skin: Skin is warm and dry. No rash noted.  Psychiatric:  Has  normal mood and affect. Behavior is normal.     Assessment & Plan:

## 2013-08-24 NOTE — Assessment & Plan Note (Signed)
For depo 80 mg IM

## 2013-08-24 NOTE — Patient Instructions (Signed)
You had the steroid shot today  Please continue all other medications as before, and refills have been done if requested. Please have the pharmacy call with any other refills you may need.  Please keep your appointments with your specialists as you have planned  Please continue your efforts at being more active, low cholesterol diet, and weight control. You are otherwise up to date with prevention measures today.  Please go to the LAB in the Basement (turn left off the elevator) for the tests to be done at your convenience  You will be contacted by phone if any changes need to be made immediately.  Otherwise, you will receive a letter about your results with an explanation, but please check with MyChart first.  Please remember to sign up for MyChart if you have not done so, as this will be important to you in the future with finding out test results, communicating by private email, and scheduling acute appointments online when needed.  Please return in 1 year for your yearly visit, or sooner if needed, with Lab testing done 3-5 days before

## 2013-08-24 NOTE — Assessment & Plan Note (Signed)

## 2013-08-27 ENCOUNTER — Other Ambulatory Visit: Payer: Self-pay | Admitting: Internal Medicine

## 2013-08-29 ENCOUNTER — Telehealth: Payer: Self-pay | Admitting: Internal Medicine

## 2013-08-29 MED ORDER — SIMVASTATIN 40 MG PO TABS: 40.0000 mg | ORAL_TABLET | Freq: Every day | ORAL | Status: DC

## 2013-08-29 MED ORDER — CYCLOBENZAPRINE HCL 5 MG PO TABS: 5.0000 mg | ORAL_TABLET | Freq: Every day | ORAL | Status: DC

## 2013-08-29 MED ORDER — PANTOPRAZOLE SODIUM 40 MG PO TBEC: 40.0000 mg | DELAYED_RELEASE_TABLET | Freq: Every day | ORAL | Status: DC

## 2013-08-29 MED ORDER — CETIRIZINE HCL 10 MG PO TABS: 10.0000 mg | ORAL_TABLET | Freq: Every day | ORAL | Status: DC

## 2013-08-29 MED ORDER — CELECOXIB 200 MG PO CAPS: 200.0000 mg | ORAL_CAPSULE | Freq: Every day | ORAL | Status: DC

## 2013-08-29 MED ORDER — FLUTICASONE PROPIONATE 50 MCG/ACT NA SUSP: 2.0000 | Freq: Every day | NASAL | Status: DC

## 2013-08-29 NOTE — Telephone Encounter (Signed)
Refills done as requested and called the patient to inform.

## 2013-08-29 NOTE — Telephone Encounter (Signed)
Pt request for Celecoxib, Cetirizine HCI, Cyclobenzaprine HCI, Fluticasone Propionate, Pantoprazole Sodium and Simvastatin. Pt stated that she request these med to be send into [facility] but they do not have it. Please advise.

## 2013-08-29 NOTE — Telephone Encounter (Signed)
Left a message refills done.

## 2013-09-14 ENCOUNTER — Ambulatory Visit (INDEPENDENT_AMBULATORY_CARE_PROVIDER_SITE_OTHER): Payer: PRIVATE HEALTH INSURANCE | Admitting: Family Medicine

## 2013-09-14 ENCOUNTER — Encounter: Payer: Self-pay | Admitting: Family Medicine

## 2013-09-14 VITALS — BP 98/70 | HR 66 | Temp 99.0°F | Ht 63.0 in | Wt 150.0 lb

## 2013-09-14 DIAGNOSIS — J020 Streptococcal pharyngitis: Secondary | ICD-10-CM

## 2013-09-14 DIAGNOSIS — J069 Acute upper respiratory infection, unspecified: Secondary | ICD-10-CM

## 2013-09-14 LAB — POCT RAPID STREP A (OFFICE): RAPID STREP A SCREEN: NEGATIVE

## 2013-09-14 NOTE — Addendum Note (Signed)
Addended by: Johnella MoloneyFUNDERBURK, Jamile Rekowski A on: 09/14/2013 05:09 PM   Modules accepted: Orders

## 2013-09-14 NOTE — Patient Instructions (Signed)
INSTRUCTIONS FOR UPPER RESPIRATORY INFECTION:  -plenty of rest and fluids  -nasal saline wash 2-3 times daily (use prepackaged nasal saline or bottled/distilled water if making your own)   -clean nose with nasal saline before using the nasal steroid or sinex  -use nasacort or flonase daily for 21 days  -can use AFRIN nasal spray for drainage and nasal congestion - but do NOT use longer then 3-4 days  -can use tylenol or ibuprofen as directed for aches and sorethroat  -in the winter time, using a humidifier at night is helpful (please follow cleaning instructions)  -if you are taking a cough medication - use only as directed, may also try a teaspoon of honey to coat the throat and throat lozenges  -for sore throat, salt water gargles can help  -follow up if you have fevers, facial pain, tooth pain, difficulty breathing or are worsening or not getting better in 5-7 days

## 2013-09-14 NOTE — Progress Notes (Signed)
Pre visit review using our clinic review tool, if applicable. No additional management support is needed unless otherwise documented below in the visit note. 

## 2013-09-14 NOTE — Progress Notes (Signed)
No chief complaint on file.   HPI:   -started: 4 days ago -symptoms:nasal congestion, sore throat, cough, PND -denies:fever, SOB, NVD, tooth pain -has tried: nasal steroid every now an then as has allergies and gets shots for this sometimes -sick contacts/travel/risks: denies flu exposure, tick exposure or or Ebola risks -Hx of: allergies ROS: See pertinent positives and negatives per HPI.  Past Medical History  Diagnosis Date  . Allergic rhinitis   . Hyperlipidemia   . ALLERGIC RHINITIS 10/03/2007  . HYPERLIPIDEMIA 08/08/2008    No past surgical history on file.  Family History  Problem Relation Age of Onset  . Arthritis Mother   . Diabetes Brother     type I, deceased    History   Social History  . Marital Status: Married    Spouse Name: N/A    Number of Children: 1  . Years of Education: N/A   Occupational History  . customer service    Social History Main Topics  . Smoking status: Never Smoker   . Smokeless tobacco: None  . Alcohol Use: No  . Drug Use: No  . Sexual Activity: None   Other Topics Concern  . None   Social History Narrative  . None    Current outpatient prescriptions:celecoxib (CELEBREX) 200 MG capsule, Take 1 capsule (200 mg total) by mouth daily. As needed, Disp: 90 capsule, Rfl: 3;  cetirizine (ZYRTEC) 10 MG tablet, Take 1 tablet (10 mg total) by mouth daily., Disp: 90 tablet, Rfl: 3;  cyclobenzaprine (FLEXERIL) 5 MG tablet, Take 1 tablet (5 mg total) by mouth daily., Disp: 30 tablet, Rfl: 4 fluticasone (FLONASE) 50 MCG/ACT nasal spray, Place 2 sprays into both nostrils daily., Disp: 16 g, Rfl: 2;  HYDROcodone-acetaminophen (NORCO/VICODIN) 5-325 MG per tablet, Take 1 tablet by mouth every 6 (six) hours as needed for pain., Disp: 10 tablet, Rfl: 0;  Ibuprofen (ADVIL PO), Take by mouth., Disp: , Rfl: ;  Norgestim-Eth Estrad Triphasic (ORTHO TRI-CYCLEN, 28, PO), Take by mouth., Disp: , Rfl:  pantoprazole (PROTONIX) 40 MG tablet, Take 1 tablet (40  mg total) by mouth daily., Disp: 30 tablet, Rfl: 11;  simvastatin (ZOCOR) 40 MG tablet, Take 1 tablet (40 mg total) by mouth daily at 6 PM., Disp: 90 tablet, Rfl: 3;  esomeprazole (NEXIUM) 40 MG capsule, Take 1 capsule (40 mg total) by mouth daily., Disp: 90 capsule, Rfl: 3  EXAM:  Filed Vitals:   09/14/13 1525  BP: 98/70  Pulse: 66  Temp: 99 F (37.2 C)    Body mass index is 26.58 kg/(m^2).  GENERAL: vitals reviewed and listed above, alert, oriented, appears well hydrated and in no acute distress  HEENT: atraumatic, conjunttiva clear, no obvious abnormalities on inspection of external nose and ears, normal appearance of ear canals and TMs, clear nasal congestion, mild post oropharyngeal erythema with PND, no tonsillar edema or exudate, no sinus TTP  NECK: no obvious masses on inspection  LUNGS: clear to auscultation bilaterally, no wheezes, rales or rhonchi, good air movement  CV: HRRR, no peripheral edema  MS: moves all extremities without noticeable abnormality  PSYCH: pleasant and cooperative, no obvious depression or anxiety  ASSESSMENT AND PLAN:  Discussed the following assessment and plan:  Upper respiratory infection  -given HPI and exam findings today, a serious infection or illness is unlikely. We discussed potential etiologies, with VURI being most likely, and advised supportive care and monitoring. We discussed treatment side effects, likely course, antibiotic misuse, transmission, and signs of developing  a serious illness. -rapid strep as children of coworker had strep and she wanted to test though unlikely -of course, we advised to return or notify a doctor immediately if symptoms worsen or persist or new concerns arise.    Patient Instructions  INSTRUCTIONS FOR UPPER RESPIRATORY INFECTION:  -plenty of rest and fluids  -nasal saline wash 2-3 times daily (use prepackaged nasal saline or bottled/distilled water if making your own)   -clean nose with nasal  saline before using the nasal steroid or sinex  -use nasacort or flonase daily for 21 days  -can use AFRIN nasal spray for drainage and nasal congestion - but do NOT use longer then 3-4 days  -can use tylenol or ibuprofen as directed for aches and sorethroat  -in the winter time, using a humidifier at night is helpful (please follow cleaning instructions)  -if you are taking a cough medication - use only as directed, may also try a teaspoon of honey to coat the throat and throat lozenges  -for sore throat, salt water gargles can help  -follow up if you have fevers, facial pain, tooth pain, difficulty breathing or are worsening or not getting better in 5-7 days      Terressa KoyanagiHannah R. Kim

## 2013-09-15 ENCOUNTER — Telehealth: Payer: Self-pay | Admitting: Internal Medicine

## 2013-09-15 NOTE — Telephone Encounter (Signed)
Pt returning your call about test results. °

## 2013-09-15 NOTE — Telephone Encounter (Signed)
I informed the pt per Dr Selena BattenKim the strep test was negative.

## 2014-01-25 ENCOUNTER — Ambulatory Visit (INDEPENDENT_AMBULATORY_CARE_PROVIDER_SITE_OTHER): Payer: PRIVATE HEALTH INSURANCE | Admitting: Internal Medicine

## 2014-01-25 ENCOUNTER — Encounter: Payer: Self-pay | Admitting: Internal Medicine

## 2014-01-25 VITALS — BP 110/70 | HR 60 | Temp 97.8°F | Ht 63.0 in | Wt 137.0 lb

## 2014-01-25 DIAGNOSIS — M79609 Pain in unspecified limb: Secondary | ICD-10-CM

## 2014-01-25 DIAGNOSIS — M79604 Pain in right leg: Secondary | ICD-10-CM

## 2014-01-25 DIAGNOSIS — M79605 Pain in left leg: Principal | ICD-10-CM

## 2014-01-25 MED ORDER — OXYCODONE-ACETAMINOPHEN 10-325 MG PO TABS: 1.0000 | ORAL_TABLET | Freq: Every day | ORAL | Status: DC | PRN

## 2014-01-25 MED ORDER — CELECOXIB 200 MG PO CAPS: 200.0000 mg | ORAL_CAPSULE | Freq: Two times a day (BID) | ORAL | Status: DC | PRN

## 2014-01-25 NOTE — Patient Instructions (Signed)
Please take all new medication as prescribed  Please continue all other medications as before, and refills have been done if requested.  Please have the pharmacy call with any other refills you may need.  Please keep your appointments with your specialists as you may have planned  Please go to the LAB in the Basement (turn left off the elevator) for the tests to be done tomorrow - the muscle test  You will be contacted by phone if any changes need to be made immediately.  Otherwise, you will receive a letter about your results with an explanation, but please check with MyChart first.  Please remember to sign up for MyChart if you have not done so, as this will be important to you in the future with finding out test results, communicating by private email, and scheduling acute appointments online when needed.  Please consider f/u with Dr Katrinka Blazing if not improved with rest after 3-5 days

## 2014-01-25 NOTE — Progress Notes (Signed)
**Note Angela-Identified via Obfuscation** Subjective:    Patient ID: Angela Webb, female    DOB: November 18, 1970, 43 y.o.   MRN: [id number]  HPI  Here to f/u, c/o 3 days onset mod bilat anterior burning leg pain, without significant assoc back pain  (though has had some recurring back pain in the past though related to degenerative changes per pt), with some radiation to the lower legs below the knees as well, worse to stand and walk, better to sit and rest.  No prior hx of same, No hx of neurogenic/vascular/msk issues with LE 's in the past.  Pt without bowel or bladder change, fever, wt loss,  worsening LE numbness/weakness, new gait change or falls.  Has been excercising with husband daily for several wks, and both have lost signficant wt, mostly with walking daily 4 miles, but also some biking on the weekends.   Past Medical History  Diagnosis Date  . Allergic rhinitis   . Hyperlipidemia   . ALLERGIC RHINITIS 10/03/2007  . HYPERLIPIDEMIA 08/08/2008   No past surgical history on file.  reports that she has never smoked. She does not have any smokeless tobacco history on file. She reports that she does not drink alcohol or use illicit drugs. family history includes Arthritis in her mother; Diabetes in her brother. Allergies  Allergen Reactions  . Levofloxacin     REACTION: LEG PAIN/CRAMPS   Current Outpatient Prescriptions on File Prior to Visit  Medication Sig Dispense Refill  . cetirizine (ZYRTEC) 10 MG tablet Take 1 tablet (10 mg total) by mouth daily.  90 tablet  3  . cyclobenzaprine (FLEXERIL) 5 MG tablet Take 1 tablet (5 mg total) by mouth daily.  30 tablet  4  . fluticasone (FLONASE) 50 MCG/ACT nasal spray Place 2 sprays into both nostrils daily.  16 g  2  . HYDROcodone-acetaminophen (NORCO/VICODIN) 5-325 MG per tablet Take 1 tablet by mouth every 6 (six) hours as needed for pain.  10 tablet  0  . Ibuprofen (ADVIL PO) Take by mouth.      Lorita Officer Triphasic (ORTHO TRI-CYCLEN, 28, PO) Take by mouth.      .  pantoprazole (PROTONIX) 40 MG tablet Take 1 tablet (40 mg total) by mouth daily.  30 tablet  11  . simvastatin (ZOCOR) 40 MG tablet Take 1 tablet (40 mg total) by mouth daily at 6 PM.  90 tablet  3  . esomeprazole (NEXIUM) 40 MG capsule Take 1 capsule (40 mg total) by mouth daily.  90 capsule  3   No current facility-administered medications on file prior to visit.   Review of Systems  Constitutional: Negative for unusual diaphoresis or other sweats  HENT: Negative for ringing in ear Eyes: Negative for double vision or worsening visual disturbance.  Respiratory: Negative for choking and stridor.   Gastrointestinal: Negative for vomiting or other signifcant bowel change Genitourinary: Negative for hematuria or decreased urine volume.  Musculoskeletal: Negative for other MSK pain or swelling Skin: Negative for color change and worsening wound.  Neurological: Negative for tremors and numbness other than noted  Psychiatric/Behavioral: Negative for decreased concentration or agitation other than above       Objective:   Physical Exam BP 110/70  Pulse 60  Temp(Src) 97.8 F (36.6 C) (Oral)  Ht  (1.6 m)  Wt 137 lb (62.143 kg)  BMI 24.27 kg/m2  SpO2 98% VS noted,  Constitutional: Pt appears well-developed, well-nourished.  HENT: Head: NCAT.  Right Ear: External ear normal.  Left Ear: External ear normal.  Eyes: . Pupils are equal, round, and reactive to light. Conjunctivae and EOM are normal Neck: Normal range of motion. Neck supple.  Cardiovascular: Normal rate and regular rhythm.   Pulmonary/Chest: Effort normal and breath sounds normal.  Abd:  Soft, NT, ND, + BS Neurological: Pt is alert. Not confused , motor grossly intact Skin: Skin is warm. No rash, dorsalis pedis 1+ bilat Bilat ant legs diffuse tender Bilat hips and knees FROM withoutpain with passive ROM, no effusions Psychiatric: Pt behavior is normal. No agitation.   Wt Readings from Last 3 Encounters:  01/25/14  137 lb (62.143 kg)  09/14/13 150 lb (68.04 kg)  08/24/13 154 lb 2 oz (69.911 kg)       Assessment & Plan:

## 2014-01-26 ENCOUNTER — Telehealth: Payer: Self-pay | Admitting: Internal Medicine

## 2014-01-26 ENCOUNTER — Other Ambulatory Visit (INDEPENDENT_AMBULATORY_CARE_PROVIDER_SITE_OTHER): Payer: PRIVATE HEALTH INSURANCE

## 2014-01-26 ENCOUNTER — Other Ambulatory Visit: Payer: Self-pay | Admitting: Internal Medicine

## 2014-01-26 ENCOUNTER — Other Ambulatory Visit: Payer: Self-pay

## 2014-01-26 DIAGNOSIS — M79609 Pain in unspecified limb: Secondary | ICD-10-CM

## 2014-01-26 DIAGNOSIS — M25569 Pain in unspecified knee: Secondary | ICD-10-CM

## 2014-01-26 DIAGNOSIS — M79605 Pain in left leg: Secondary | ICD-10-CM

## 2014-01-26 DIAGNOSIS — M79604 Pain in right leg: Secondary | ICD-10-CM

## 2014-01-26 DIAGNOSIS — Z0000 Encounter for general adult medical examination without abnormal findings: Secondary | ICD-10-CM

## 2014-01-26 LAB — URINALYSIS, ROUTINE W REFLEX MICROSCOPIC
Bilirubin Urine: NEGATIVE
KETONES UR: NEGATIVE
Leukocytes, UA: NEGATIVE
NITRITE: NEGATIVE
PH: 7 (ref 5.0–8.0)
RBC / HPF: NONE SEEN (ref 0–?)
Total Protein, Urine: NEGATIVE
UROBILINOGEN UA: 0.2 (ref 0.0–1.0)
Urine Glucose: NEGATIVE
WBC UA: NONE SEEN (ref 0–?)

## 2014-01-26 LAB — LIPID PANEL
CHOLESTEROL: 188 mg/dL (ref 0–200)
HDL: 78.3 mg/dL (ref >39.00)
LDL Cholesterol: 81 mg/dL (ref 0–99)
NONHDL: 109.7
Total CHOL/HDL Ratio: 2
Triglycerides: 143 mg/dL (ref 0.0–149.0)
VLDL: 28.6 mg/dL (ref 0.0–40.0)

## 2014-01-26 LAB — CBC WITH DIFFERENTIAL/PLATELET
Basophils Absolute: 0.1 10*3/uL (ref 0.0–0.1)
Basophils Relative: 0.7 % (ref 0.0–3.0)
EOS PCT: 1.3 % (ref 0.0–5.0)
Eosinophils Absolute: 0.1 10*3/uL (ref 0.0–0.7)
HEMATOCRIT: 35.2 % — AB (ref 36.0–46.0)
HEMOGLOBIN: 12.1 g/dL (ref 12.0–15.0)
LYMPHS ABS: 2.9 10*3/uL (ref 0.7–4.0)
Lymphocytes Relative: 34.3 % (ref 12.0–46.0)
MCHC: 34.4 g/dL (ref 30.0–36.0)
MCV: 91.1 fl (ref 78.0–100.0)
Monocytes Absolute: 0.6 10*3/uL (ref 0.1–1.0)
Monocytes Relative: 7.6 % (ref 3.0–12.0)
NEUTROS ABS: 4.8 10*3/uL (ref 1.4–7.7)
Neutrophils Relative %: 56.1 % (ref 43.0–77.0)
PLATELETS: 279 10*3/uL (ref 150.0–400.0)
RBC: 3.86 Mil/uL — ABNORMAL LOW (ref 3.87–5.11)
RDW: 13.3 % (ref 11.5–15.5)
WBC: 8.5 10*3/uL (ref 4.0–10.5)

## 2014-01-26 LAB — HEPATIC FUNCTION PANEL
ALBUMIN: 3.6 g/dL (ref 3.5–5.2)
ALT: 12 U/L (ref 0–35)
AST: 14 U/L (ref 0–37)
Alkaline Phosphatase: 36 U/L — ABNORMAL LOW (ref 39–117)
Bilirubin, Direct: 0 mg/dL (ref 0.0–0.3)
TOTAL PROTEIN: 6.9 g/dL (ref 6.0–8.3)
Total Bilirubin: 0.5 mg/dL (ref 0.2–1.2)

## 2014-01-26 LAB — BASIC METABOLIC PANEL
BUN: 16 mg/dL (ref 6–23)
CO2: 28 mEq/L (ref 19–32)
Calcium: 9.2 mg/dL (ref 8.4–10.5)
Chloride: 101 mEq/L (ref 96–112)
Creatinine, Ser: 1 mg/dL (ref 0.4–1.2)
GFR: 63.78 mL/min (ref >60.00)
Glucose, Bld: 111 mg/dL — ABNORMAL HIGH (ref 70–99)
Potassium: 3.8 mEq/L (ref 3.5–5.1)
Sodium: 134 mEq/L — ABNORMAL LOW (ref 135–145)

## 2014-01-26 LAB — CK: Total CK: 63 U/L (ref 7–177)

## 2014-01-26 MED ORDER — HYDROCODONE-ACETAMINOPHEN 10-325 MG PO TABS: 1.0000 | ORAL_TABLET | Freq: Four times a day (QID) | ORAL | Status: DC | PRN

## 2014-01-26 MED ORDER — HYDROCODONE-ACETAMINOPHEN 5-325 MG PO TABS: 1.0000 | ORAL_TABLET | Freq: Four times a day (QID) | ORAL | Status: DC | PRN

## 2014-01-26 NOTE — Telephone Encounter (Signed)
i remember she said tramadol caused issue as well  OK to take HALF of the vicodin 10-325 prn, as this should be ok

## 2014-01-26 NOTE — Telephone Encounter (Signed)
Pt states she was seen yesterday and prescribed OXYCODINE (PERCOCET) tablets. Pt states she has been very sick on her stomach when taking the medication and requests to be prescribed an alternative med. Please contact pt when request is reviewed.

## 2014-01-26 NOTE — Telephone Encounter (Signed)
To d/c percocet  For vicodin refill - done hardcopy

## 2014-01-26 NOTE — Assessment & Plan Note (Signed)
supect MSK strain, mod to severe related to recent onset daily exercise, cant completely r/o neurogenic, vascular or even joint issue, but would try conserv tx for now before other evaluation, with vicodin prn, check CK, hold exercise for 3-5 days trial, consider refer to Dr Smith/sport med if persists,

## 2014-01-29 LAB — TSH: TSH: 2.81 u[IU]/mL (ref 0.35–4.50)

## 2014-01-30 ENCOUNTER — Encounter: Payer: Self-pay | Admitting: Internal Medicine

## 2014-05-23 ENCOUNTER — Telehealth: Payer: Self-pay

## 2014-05-23 MED ORDER — NAPROXEN 500 MG PO TABS: 500.0000 mg | ORAL_TABLET | Freq: Two times a day (BID) | ORAL | Status: DC

## 2014-05-23 NOTE — Telephone Encounter (Signed)
Refill request for Naprosyn 500 mg tab BID not on current list, advise please.

## 2014-05-23 NOTE — Telephone Encounter (Signed)
ok 

## 2014-05-23 NOTE — Telephone Encounter (Signed)
Ok for naprosyn, but celebrex is taken off the list

## 2014-06-05 ENCOUNTER — Encounter: Payer: Self-pay | Admitting: Internal Medicine

## 2014-06-05 ENCOUNTER — Ambulatory Visit (INDEPENDENT_AMBULATORY_CARE_PROVIDER_SITE_OTHER): Payer: PRIVATE HEALTH INSURANCE | Admitting: Internal Medicine

## 2014-06-05 VITALS — BP 118/78 | HR 63 | Temp 98.4°F | Ht 63.0 in | Wt 137.2 lb

## 2014-06-05 DIAGNOSIS — M79605 Pain in left leg: Secondary | ICD-10-CM

## 2014-06-05 DIAGNOSIS — M545 Low back pain, unspecified: Secondary | ICD-10-CM

## 2014-06-05 DIAGNOSIS — R232 Flushing: Secondary | ICD-10-CM | POA: Insufficient documentation

## 2014-06-05 DIAGNOSIS — M79604 Pain in right leg: Secondary | ICD-10-CM

## 2014-06-05 DIAGNOSIS — N951 Menopausal and female climacteric states: Secondary | ICD-10-CM

## 2014-06-05 DIAGNOSIS — M25511 Pain in right shoulder: Secondary | ICD-10-CM | POA: Insufficient documentation

## 2014-06-05 DIAGNOSIS — J069 Acute upper respiratory infection, unspecified: Secondary | ICD-10-CM | POA: Insufficient documentation

## 2014-06-05 MED ORDER — PANTOPRAZOLE SODIUM 40 MG PO TBEC: 40.0000 mg | DELAYED_RELEASE_TABLET | Freq: Every day | ORAL | Status: DC

## 2014-06-05 MED ORDER — PREDNISONE 10 MG PO TABS: ORAL_TABLET | ORAL | Status: DC

## 2014-06-05 MED ORDER — SIMVASTATIN 40 MG PO TABS: 40.0000 mg | ORAL_TABLET | Freq: Every day | ORAL | Status: DC

## 2014-06-05 MED ORDER — CETIRIZINE HCL 10 MG PO TABS: 10.0000 mg | ORAL_TABLET | Freq: Every day | ORAL | Status: DC

## 2014-06-05 MED ORDER — CELECOXIB 200 MG PO CAPS: ORAL_CAPSULE | ORAL | Status: DC

## 2014-06-05 MED ORDER — TIZANIDINE HCL 4 MG PO TABS: 4.0000 mg | ORAL_TABLET | Freq: Four times a day (QID) | ORAL | Status: DC | PRN

## 2014-06-05 MED ORDER — AZITHROMYCIN 250 MG PO TABS: ORAL_TABLET | ORAL | Status: DC

## 2014-06-05 MED ORDER — ESCITALOPRAM OXALATE 10 MG PO TABS: 10.0000 mg | ORAL_TABLET | Freq: Every day | ORAL | Status: DC

## 2014-06-05 MED ORDER — HYDROCODONE-ACETAMINOPHEN 10-325 MG PO TABS: 1.0000 | ORAL_TABLET | Freq: Four times a day (QID) | ORAL | Status: DC | PRN

## 2014-06-05 NOTE — Assessment & Plan Note (Signed)
Also for muscle relaxer prn,  to f/u any worsening symptoms or concerns

## 2014-06-05 NOTE — Assessment & Plan Note (Signed)
C/w prob pre menopause , for lexapro 10 qd

## 2014-06-05 NOTE — Progress Notes (Signed)
**Note Angela-Identified via Obfuscation** Subjective:    Patient ID: Angela Webb, female    DOB: [birth year], 44 y.o.   MRN: [id number]  HPI  Here with 10 days acute onset lower midline back pain, sharp and dull with radiation to both legs to just below the knees, no bowel or bladder change, fever, wt loss,  worsening LE numbness/weakness, gait change or falls.  Worse to bend and stand up, overall mod to severe at times, better to lie down and has TENS units at home that helps temporarily. Current naproxyn no help. Started after playing with children in the snow. Asks for referral to Dr Smith/sport med, states was told she needs referral with her insurance.  Also has right shoulder pain over the same time, mild to mod, worse to reach back backwards, has o/w FROM.  No neck or radicular pain.  Also with ongoing hot flashes for several months, mild mood swings and no menses since sept 2015, stopped her BCP's.   Also incidentally -  Here with 2-3 days acute onset fever, facial pain, pressure, headache, general weakness and malaise, and greenish d/c, with mild ST and cough, but pt denies chest pain, wheezing, increased sob or doe, orthopnea, PND, increased LE swelling, palpitations, dizziness or syncope. Past Medical History  Diagnosis Date  . Allergic rhinitis   . Hyperlipidemia   . ALLERGIC RHINITIS 10/03/2007  . HYPERLIPIDEMIA 08/08/2008   No past surgical history on file.  reports that she has never smoked. She does not have any smokeless tobacco history on file. She reports that she does not drink alcohol or use illicit drugs. family history includes Arthritis in her mother; Diabetes in her brother. Allergies  Allergen Reactions  . Levofloxacin     REACTION: LEG PAIN/CRAMPS  . Tramadol Nausea Only   Current Outpatient Prescriptions on File Prior to Visit  Medication Sig Dispense Refill  . cyclobenzaprine (FLEXERIL) 5 MG tablet Take 1 tablet (5 mg total) by mouth daily. 30 tablet 4  . esomeprazole (NEXIUM) 40 MG capsule Take 1 capsule  (40 mg total) by mouth daily. 90 capsule 3  . fluticasone (FLONASE) 50 MCG/ACT nasal spray Place 2 sprays into both nostrils daily. (Patient not taking: Reported on 06/05/2014) 16 g 2  . Ibuprofen (ADVIL PO) Take by mouth.     No current facility-administered medications on file prior to visit.   Review of Systems  Constitutional: Negative for unusual diaphoresis or other sweats  HENT: Negative for ringing in ear Eyes: Negative for double vision or worsening visual disturbance.  Respiratory: Negative for choking and stridor.   Gastrointestinal: Negative for vomiting or other signifcant bowel change Genitourinary: Negative for hematuria or decreased urine volume.  Musculoskeletal: Negative for other MSK pain or swelling Skin: Negative for color change and worsening wound.  Neurological: Negative for tremors and numbness other than noted  Psychiatric/Behavioral: Negative for decreased concentration or agitation other than above   \    Objective:   Physical Exam BP 118/78 mmHg  Pulse 63  Temp(Src) 98.4 F (36.9 C) (Oral)  Ht  (1.6 m)  Wt 137 lb 4 oz (62.256 kg)  BMI 24.32 kg/m2  SpO2 99% VS noted, mild ll  Constitutional: Pt appears well-developed, well-nourished.  HENT: Head: NCAT.  Right Ear: External ear normal.  Left Ear: External ear normal.  Eyes: . Pupils are equal, round, and reactive to light. Conjunctivae and EOM are normal Bilat tm's with mild erythema.  Max sinus areas mild tender.  Pharynx with mild  erythema, no exudate Neck: Normal range of motion. Neck supple.  Cardiovascular: Normal rate and regular rhythm.   Pulmonary/Chest: Effort normal and breath sounds without rales or wheezing.  Abd:  Soft, NT, ND, + BS Spine tender midline lowest lumbar, mild bilat paravertebral tender, no sweling or rash Neurological: Pt is alert. Not confused , motor intact 5/5,sens/dtr/gait intact Right should NT, FROM except pain to full backward extension Skin: Skin is warm. No  rash Psychiatric: Pt behavior is normal. No agitation. mild nervous    Assessment & Plan:

## 2014-06-05 NOTE — Assessment & Plan Note (Signed)
Mild to mod, for antibx course,  to f/u any worsening symptoms or concerns 

## 2014-06-05 NOTE — Assessment & Plan Note (Addendum)
C/w flare of pain from known lumbar DJD/DDD, for predpac asd, hydrocodone limited rx, for Dr Katrinka BlazingSmith per pt request  Note:  Total time for pt hx, exam, review of record with pt in the room, determination of diagnoses and plan for further eval and tx is > 40 min, with over 50% spent in coordination and counseling of patient

## 2014-06-05 NOTE — Patient Instructions (Signed)
Please take all new medication as prescribed - the hydrocodone, celebrex, prednsone, muscle relaxer, antibiotic and lexapro 10 mg - all as prescribed  You will be contacted regarding the referral for: Dr Katrinka BlazingSmith   Please continue all other medications as before, and refills have been done if requested.  Please have the pharmacy call with any other refills you may need.  Please keep your appointments with your specialists as you may have planned

## 2014-06-05 NOTE — Assessment & Plan Note (Signed)
C/w strain, for celebrex bid prn,  to f/u any worsening symptoms or concerns

## 2014-06-08 ENCOUNTER — Ambulatory Visit: Payer: PRIVATE HEALTH INSURANCE | Admitting: Family Medicine

## 2014-06-15 ENCOUNTER — Ambulatory Visit: Payer: PRIVATE HEALTH INSURANCE | Admitting: Family Medicine

## 2014-09-12 ENCOUNTER — Other Ambulatory Visit: Payer: Self-pay | Admitting: *Deleted

## 2014-09-12 MED ORDER — CYCLOBENZAPRINE HCL 5 MG PO TABS: 5.0000 mg | ORAL_TABLET | Freq: Every day | ORAL | Status: DC

## 2014-10-16 ENCOUNTER — Encounter: Payer: Self-pay | Admitting: Family Medicine

## 2014-10-16 ENCOUNTER — Ambulatory Visit (INDEPENDENT_AMBULATORY_CARE_PROVIDER_SITE_OTHER): Payer: PRIVATE HEALTH INSURANCE | Admitting: Family Medicine

## 2014-10-16 ENCOUNTER — Encounter: Payer: Self-pay | Admitting: *Deleted

## 2014-10-16 VITALS — BP 120/74 | HR 63 | Ht 63.0 in | Wt 142.0 lb

## 2014-10-16 DIAGNOSIS — M545 Low back pain, unspecified: Secondary | ICD-10-CM

## 2014-10-16 DIAGNOSIS — M5416 Radiculopathy, lumbar region: Secondary | ICD-10-CM

## 2014-10-16 NOTE — Progress Notes (Signed)
[facility] [address] [place], [place] [address] Phone: [phone] Subjective:    I'm seeing this patient by the request  of:  Oliver Barre, MD   CC: back pain  [id number] Angela Webb is a 44 y.o. female coming in with complaint of back pain. Patient is an low back pain for quite some time. Patient given multiple intramuscular injections by primary care provider over the course of multiple years. Patient has been told that she has degenerative disc disease but x-rays are unavailable at this time. Patient has tried different medications and continues to take Celebrex, Flexeril, and has had a prescription for hydrocodone in the past.Patient describes the pain. Patient has had times when patient has had also prednisone orally. Patient is seen another specialists for this previously. Patient states that she has been diagnosed with severe degenerative disc disease previously. Patient has gone through formal physical therapy and continues on pain medication. Patient has not tried any epidurals. Patient states that she no longer does any work around the house and has her husband doing it. Patient does have some mild radicular symptom she states on the lateral aspect of her legs if she is doing a lot of activity. Patient states that she can have cramping in her legs at night as well. Patient denies any fevers or chills or any abnormal weight loss. Denies any swelling of any of the joints from time to time. States that as long as she keeps with only doing daily activities she does not have any significant pain but if she tries to stand for greater than 45-50 minutes she has unfortunately pain that can last multiple days.      Past Medical History  Diagnosis Date  . Allergic rhinitis   . Hyperlipidemia   . ALLERGIC RHINITIS 10/03/2007  . HYPERLIPIDEMIA 08/08/2008   No past surgical history on file. History  Substance Use Topics  . Smoking status: Never Smoker   .  Smokeless tobacco: Not on file  . Alcohol Use: No   Allergies  Allergen Reactions  . Levofloxacin     REACTION: LEG PAIN/CRAMPS  . Tramadol Nausea Only   Family History  Problem Relation Age of Onset  . Arthritis Mother   . Diabetes Brother     type I, deceased     Past medical history, social, surgical and family history all reviewed in electronic medical record.   Review of Systems: No headache, visual changes, nausea, vomiting, diarrhea, constipation, dizziness, abdominal pain, skin rash, fevers, chills, night sweats, weight loss, swollen lymph nodes, body aches, joint swelling, muscle aches, chest pain, shortness of breath, mood changes.   Objective Blood pressure 120/74, pulse 63, height 5\' 3"  (1.6 m), weight 142 lb (64.411 kg), SpO2 99 %.  General: No apparent distress alert and oriented x3 mood and affect normal, dressed appropriately.  HEENT: Pupils equal, extraocular movements intact  Respiratory: Patient's speak in full sentences and does not appear short of breath  Cardiovascular: No lower extremity edema, non tender, no erythema  Skin: Warm dry intact with no signs of infection or rash on extremities or on axial skeleton.  Abdomen: Soft nontender  Neuro: Cranial nerves II through XII are intact, neurovascularly intact in all extremities with 2+ DTRs and 2+ pulses.  Lymph: No lymphadenopathy of posterior or anterior cervical chain or axillae bilaterally.  Gait normal with good balance and coordination.  MSK:  Non tender with full range of motion and good stability and  symmetric strength and tone of shoulders, elbows, wrist, hip, knee and ankles bilaterally.  Back Exam:  Inspection: Unremarkable  Motion: Flexion 45 deg, Extension 45 deg, Side Bending to 45 deg bilaterally,  Rotation to 45 deg bilaterally  SLR laying: minorly positive bilateral with lateral pain on the legs. XSLR laying: Negative  Palpable tenderness: mild nonspecific tenderness to paraspinal  musculature bilaterally of the lumbar spine as well as the sacroiliac joints right greater than left. FABER: minorly positive bilaterally Sensory change: Gross sensation intact to all lumbar and sacral dermatomes.  Reflexes: 2+ at both patellar tendons, 2+ at achilles tendons, Babinski's downgoing.  Strength at foot  Plantar-flexion: 5/5 Dorsi-flexion: 5/5 Eversion: 5/5 Inversion: 5/5  Leg strength  Quad: 5/5 Hamstring: 5/5 Hip flexor: 5/5 Hip abductors: 4+/5  Gait unremarkable.  Procedure note 97110; 15 minutes spent for Therapeutic exercises as stated in above notes.  This included exercises focusing on stretching, strengthening, with significant focus on eccentric aspects. Low back exercises that included:  Pelvic tilt/bracing instruction to focus on control of the pelvic girdle and lower abdominal muscles  Glute strengthening exercises, focusing on proper firing of the glutes without engaging the low back muscles Proper stretching techniques for maximum relief for the hamstrings, hip flexors, low back and some rotation where tolerated   Proper technique shown and discussed handout in great detail with ATC.  All questions were discussed and answered.     Impression and Recommendations:     This [place] required medical decision making of moderate complexity.

## 2014-10-16 NOTE — Assessment & Plan Note (Addendum)
Patient's lower back pain has been more of a chronic issue. I do believe the patient is having more of a sacroiliac joint dysfunction. I would like to get x-rays for further evaluation. Patient does state she's had an MRI in 2 years ago and we'll see if we can find scan. Patient is for pain medication which I do not think will be beneficial for this individual. Patient told to try some over-the-counter natural supplementations which I think be more helpful. Patient does have muscle relaxer as needed and we'll do more of an icing protocol. Patient does have a back brace for increasing activity. Patient was ergonomics done at work. Patient and will come back and see me again in 3 weeks. At that time depending on x-ray we will consider either manipulation as well as potentially change some of her other medications including her cholesterol medication. Patient also may be a candidate for more of a neurologic later such as gabapentin. We will discuss and evaluate and manage based on patient's  Next exam.patient continues to have pain to we may need to consider autoimmune workup.

## 2014-10-16 NOTE — Patient Instructions (Addendum)
Good to see you.  Ice 20 minutes 2 times daily. Usually after activity and before bed. Exercises 3 times a week.  Sacroiliac Joint Mobilization and Rehab 1. Work on pretzel stretching, shoulder back and leg draped in front. 3-5 sets, 30 sec.. 2. hip abductor rotations. standing, hip flexion and rotation outward then inward. 3 sets, 15 reps. when can do comfortably, add ankle weights starting at 2 pounds.  3. cross over stretching - shoulder back to ground, same side leg crossover. 3-5 sets for 30 min..  4. rolling up and back knees to chest and rocking. 5. sacral tilt - 5 sets, hold for 5-10 seconds Lets get repeat xrays downstairs today as well with me not having them on hand.  Vitamin D 2000 IU daily Turmeric 500mg  twice daily, watch though if it hurts your stomach.  Fish oil 2 grams daily.  iron 65mg  daily  Muscle relaxer as needed Back brace with a lot of activity not daily See me again in 3 weeks and depending we will consider manipulation.

## 2014-10-16 NOTE — Progress Notes (Signed)
Pre visit review using our clinic review tool, if applicable. No additional management support is needed unless otherwise documented below in the visit note. 

## 2014-11-06 ENCOUNTER — Ambulatory Visit (INDEPENDENT_AMBULATORY_CARE_PROVIDER_SITE_OTHER): Payer: PRIVATE HEALTH INSURANCE | Admitting: Family Medicine

## 2014-11-06 ENCOUNTER — Encounter: Payer: Self-pay | Admitting: Family Medicine

## 2014-11-06 VITALS — BP 122/82 | HR 60 | Ht 63.0 in | Wt 140.0 lb

## 2014-11-06 DIAGNOSIS — M545 Low back pain: Secondary | ICD-10-CM

## 2014-11-06 DIAGNOSIS — M79604 Pain in right leg: Secondary | ICD-10-CM | POA: Diagnosis not present

## 2014-11-06 DIAGNOSIS — M7630 Iliotibial band syndrome, unspecified leg: Secondary | ICD-10-CM

## 2014-11-06 DIAGNOSIS — G8929 Other chronic pain: Secondary | ICD-10-CM

## 2014-11-06 DIAGNOSIS — M5136 Other intervertebral disc degeneration, lumbar region: Secondary | ICD-10-CM

## 2014-11-06 DIAGNOSIS — M79605 Pain in left leg: Secondary | ICD-10-CM

## 2014-11-06 MED ORDER — CELECOXIB 200 MG PO CAPS: ORAL_CAPSULE | ORAL | Status: DC

## 2014-11-06 MED ORDER — GABAPENTIN 100 MG PO CAPS: 200.0000 mg | ORAL_CAPSULE | Freq: Every day | ORAL | Status: DC

## 2014-11-06 NOTE — Progress Notes (Signed)
[facility] [address] [place], [place] [address] Phone: [phone] Subjective:    I'm seeing this patient by the request  of:  Oliver BarreJames John, MD   CC: back pain  BJY:[id number]:Subjective [name] Z Prochnow is a 44 y.o. female coming in with complaint of back pain. Patient is an low back pain for quite some time. Patient given multiple intramuscular injections by primary care provider over the course of multiple years. Patient has been told that she has degenerative disc disease and patient did bring an MRI. Patient does have severe degenerative disc disease at L5-S1. Patient has tried different medications and continues to take Celebrex, Flexeril, and has had a prescription for hydrocodone in the past.Patient describes the pain. . Patient has gone through formal physical therapy and continues on pain medication. Patient has not tried any epidurals.  Patient states that she continues to have some difficulty. Has noticed some mild improvement with the over-the-counter natural supplementations and some the home exercises. Patient though states that now the pain seems to be a more on the lateral aspects of the leg. Patient has been trying to walk on a more regular basis but has some difficulty.    Past Medical History  Diagnosis Date  . Allergic rhinitis   . Hyperlipidemia   . ALLERGIC RHINITIS 10/03/2007  . HYPERLIPIDEMIA 08/08/2008   No past surgical history on file. History  Substance Use Topics  . Smoking status: Never Smoker   . Smokeless tobacco: Not on file  . Alcohol Use: No   Allergies  Allergen Reactions  . Levofloxacin     REACTION: LEG PAIN/CRAMPS  . Tramadol Nausea Only   Family History  Problem Relation Age of Onset  . Arthritis Mother   . Diabetes Brother     type I, deceased     Past medical history, social, surgical and family history all reviewed in electronic medical record.   Review of Systems: No headache, visual changes, nausea, vomiting,  diarrhea, constipation, dizziness, abdominal pain, skin rash, fevers, chills, night sweats, weight loss, swollen lymph nodes, body aches, joint swelling, muscle aches, chest pain, shortness of breath, mood changes.   Objective Blood pressure 122/82, pulse 60, height 5\' 3"  (1.6 m), weight 140 lb (63.504 kg), SpO2 97 %.  General: No apparent distress alert and oriented x3 mood and affect normal, dressed appropriately.  HEENT: Pupils equal, extraocular movements intact  Respiratory: Patient's speak in full sentences and does not appear short of breath  Cardiovascular: No lower extremity edema, non tender, no erythema  Skin: Warm dry intact with no signs of infection or rash on extremities or on axial skeleton.  Abdomen: Soft nontender  Neuro: Cranial nerves II through XII are intact, neurovascularly intact in all extremities with 2+ DTRs and 2+ pulses.  Lymph: No lymphadenopathy of posterior or anterior cervical chain or axillae bilaterally.  Gait normal with good balance and coordination.  MSK:  Non tender with full range of motion and good stability and symmetric strength and tone of shoulders, elbows, wrist, hip, knee and ankles bilaterally.  Back Exam:  Inspection: Unremarkable  Motion: Flexion 45 deg, Extension 45 deg, Side Bending to 45 deg bilaterally,  Rotation to 45 deg bilaterally  SLR laying: Mild positive right side still present XSLR laying: Negative  Palpable tenderness:tenderness of the paraspinal musculature but less than previous exam. Mild tenderness more of the distal iliotibial band. FABER: minorly positive bilaterally, worse with IT band  Sensory change: Gross sensation  intact to all lumbar and sacral dermatomes.  Reflexes: 2+ at both patellar tendons, 2+ at achilles tendons, Babinski's downgoing.  Strength at foot  Plantar-flexion: 5/5 Dorsi-flexion: 5/5 Eversion: 5/5 Inversion: 5/5  Leg strength  Quad: 5/5 Hamstring: 5/5 Hip flexor: 5/5 Hip abductors: 4+/5  Gait  unremarkable. Continues to be relatively constant.     Impression and Recommendations:     This [place] required medical decision making of moderate complexity.

## 2014-11-06 NOTE — Progress Notes (Signed)
Pre visit review using our clinic review tool, if applicable. No additional management support is needed unless otherwise documented below in the visit note. 

## 2014-11-06 NOTE — Assessment & Plan Note (Signed)
There is a chance the patient's bilateral leg pain is secondary to her low back pain as well as the degenerative disc disease at the L5-S1 level. Patient's pain seems to be more on the lateral aspect and think the distal iliotibial band could be plain role. I like patient to start with formal physical therapy. Patient has never done physical therapy for this previously. Patient is also be started on gabapentin for more of an nerve modulator.

## 2014-11-06 NOTE — Patient Instructions (Addendum)
Good to see you Ice is your friend Black Cohash  200 mg 1 time a day can help  Continue the other vitamins Physicla therapy will be calling you Gabapentin  at night for first week then  nightly thereafter Bump up to celebrex 2 times a day See me again in 4 weeks.

## 2014-11-16 ENCOUNTER — Ambulatory Visit: Payer: 59 | Admitting: Physical Therapy

## 2014-11-16 ENCOUNTER — Telehealth: Payer: Self-pay | Admitting: Family Medicine

## 2014-11-16 NOTE — Telephone Encounter (Signed)
Patient states she is having pain in lower back and down left leg.  She is requesting script for hydrocodone.

## 2014-11-26 NOTE — Telephone Encounter (Signed)
lmovm apologizing to pt that nobody from our office has contacted her. I asked for pt to return my call & discuss what exactly is going on with her back & leg.

## 2014-12-04 ENCOUNTER — Ambulatory Visit: Payer: PRIVATE HEALTH INSURANCE | Admitting: Family Medicine

## 2015-04-09 ENCOUNTER — Other Ambulatory Visit: Payer: Self-pay | Admitting: *Deleted

## 2015-04-09 MED ORDER — GABAPENTIN 100 MG PO CAPS: 200.0000 mg | ORAL_CAPSULE | Freq: Every day | ORAL | Status: DC

## 2015-04-09 NOTE — Telephone Encounter (Signed)
Refill done.  

## 2015-05-30 ENCOUNTER — Other Ambulatory Visit: Payer: Self-pay | Admitting: Internal Medicine

## 2015-06-20 ENCOUNTER — Other Ambulatory Visit: Payer: Self-pay | Admitting: Internal Medicine

## 2015-07-20 ENCOUNTER — Other Ambulatory Visit: Payer: Self-pay | Admitting: Family Medicine

## 2015-07-22 NOTE — Telephone Encounter (Signed)
Refill done.  

## 2015-08-12 ENCOUNTER — Telehealth: Payer: Self-pay | Admitting: Family Medicine

## 2015-08-12 NOTE — Telephone Encounter (Signed)
Patient is off week of the 17th.  She is requesting to be worked in for shoulder and arm issues. Please follow up with patient.

## 2015-08-12 NOTE — Telephone Encounter (Signed)
lmovm for pt to return call.  

## 2015-08-21 ENCOUNTER — Ambulatory Visit (INDEPENDENT_AMBULATORY_CARE_PROVIDER_SITE_OTHER): Payer: BLUE CROSS/BLUE SHIELD | Admitting: Internal Medicine

## 2015-08-21 ENCOUNTER — Encounter: Payer: Self-pay | Admitting: Internal Medicine

## 2015-08-21 VITALS — BP 116/70 | HR 58 | Temp 98.2°F | Resp 20 | Wt 136.0 lb

## 2015-08-21 DIAGNOSIS — J309 Allergic rhinitis, unspecified: Secondary | ICD-10-CM | POA: Diagnosis not present

## 2015-08-21 DIAGNOSIS — M79601 Pain in right arm: Secondary | ICD-10-CM | POA: Diagnosis not present

## 2015-08-21 DIAGNOSIS — M79605 Pain in left leg: Secondary | ICD-10-CM

## 2015-08-21 DIAGNOSIS — M545 Low back pain, unspecified: Secondary | ICD-10-CM

## 2015-08-21 DIAGNOSIS — M79604 Pain in right leg: Secondary | ICD-10-CM

## 2015-08-21 MED ORDER — CYCLOBENZAPRINE HCL 5 MG PO TABS: 5.0000 mg | ORAL_TABLET | Freq: Every day | ORAL | Status: DC

## 2015-08-21 MED ORDER — PREDNISONE 10 MG PO TABS
ORAL_TABLET | ORAL | Status: DC
Start: 2015-08-21 — End: 2016-11-05

## 2015-08-21 MED ORDER — CELECOXIB 200 MG PO CAPS: ORAL_CAPSULE | ORAL | Status: DC

## 2015-08-21 NOTE — Progress Notes (Signed)
**Note Angela-Identified via Obfuscation** Subjective:    Patient ID: Angela Webb, female    DOB: July 23, 1970, 45 y.o.   MRN: [id number]  HPI  Here with 3 wks onset mild to mod burning pain to the right arm just distal to the elbow and medial aspect, worse after sleep or otherwise have elbow in full flexion such as with sleeping, some radiation of the discomfort to the wrist and hand but no weakness or numbness.  Leaning directly on the area during the day such as sitting in a chair with her right arm on arm of the chair.  Pt continues to have recurring LBP without change in severity, bowel or bladder change, fever, wt loss,  worsening LE numbness/weakness, gait change or falls.  Separate from this it seems could be many months of bilat recurring thigh discomfort, seems random, dull, without obvious assoc with lower back pain, and without other numb/weakness. Asks for muscle relaxer trial.  Also, Does have several wks ongoing nasal allergy symptoms with clearish congestion, itch and sneezing, without fever, pain, ST, cough, swelling or wheezing. Pt denies chest pain, increased sob or doe, wheezing, orthopnea, PND, increased LE swelling, palpitations, dizziness or syncope.   Pt denies polydipsia, polyuria Past Medical History  Diagnosis Date  . Allergic rhinitis   . Hyperlipidemia   . ALLERGIC RHINITIS 10/03/2007  . HYPERLIPIDEMIA 08/08/2008  . Medical history non-contributory   . Neuromuscular disorder [facility])     DDD   Past Surgical History  Procedure Laterality Date  . No past surgeries    . I&d extremity Left 08/22/2015    Procedure: IRRIGATION AND DEBRIDEMENT EXTREMITY;  Surgeon: Betha Loa, MD;  Location: [facility];  Service: Orthopedics;  Laterality: Left;  I & d left thumb    reports that she has never smoked. She has never used smokeless tobacco. She reports that she drinks about 0.6 oz of alcohol per week. She reports that she does not use illicit drugs. family history includes Arthritis in her mother; Diabetes in  her brother. Allergies  Allergen Reactions  . Levofloxacin Anaphylaxis  . Levofloxacin     REACTION: LEG PAIN/CRAMPS  . Tramadol Nausea Only   Current Outpatient Prescriptions on File Prior to Visit  Medication Sig Dispense Refill  . cetirizine (ZYRTEC) 10 MG tablet Take 1 tablet (10 mg total) by mouth daily. 90 tablet 3  . fluticasone (FLONASE) 50 MCG/ACT nasal spray Place 2 sprays into both nostrils daily. 16 g 2  . gabapentin (NEURONTIN) 100 MG capsule TAKE 2 CAPSULES (200 MG TOTAL) BY MOUTH AT BEDTIME. 60 capsule 1  . Ibuprofen (ADVIL PO) Take by mouth.    . pantoprazole (PROTONIX) 40 MG tablet Take 1 tablet (40 mg total) by mouth daily. 90 tablet 3  . simvastatin (ZOCOR) 40 MG tablet TAKE ONE TABLET BY MOUTH DAILY AT 6 PM 90 tablet 2  . escitalopram (LEXAPRO) 10 MG tablet Take 1 tablet (10 mg total) by mouth daily. 90 tablet 3  . esomeprazole (NEXIUM) 40 MG capsule Take 1 capsule (40 mg total) by mouth daily. 90 capsule 3   No current facility-administered medications on file prior to visit.   Review of Systems  Constitutional: Negative for unusual diaphoresis or night sweats HENT: Negative for ear swelling or discharge Eyes: Negative for worsening visual haziness  Respiratory: Negative for choking and stridor.   Gastrointestinal: Negative for distension or worsening eructation Genitourinary: Negative for retention or change in urine volume.  Musculoskeletal: Negative for other MSK pain  or swelling Skin: Negative for color change and worsening wound Neurological: Negative for tremors and numbness other than noted  Psychiatric/Behavioral: Negative for decreased concentration or agitation other than above       Objective:   Physical Exam BP 116/70 mmHg  Pulse 58  Temp(Src) 98.2 F (36.8 C) (Oral)  Resp 20  Wt 136 lb (61.689 kg)  SpO2 97% VS noted,  Constitutional: Pt appears in no apparent distress HENT: Head: NCAT.  Right Ear: External ear normal.  Left Ear:  External ear normal.  Bilat tm's with mild erythema.  Max sinus areas non tender.  Pharynx with mild erythema, no exudate Eyes: . Pupils are equal, round, and reactive to light. Conjunctivae and EOM are normal Neck: Normal range of motion. Neck supple.  Cardiovascular: Normal rate and regular rhythm.   Pulmonary/Chest: Effort normal and breath sounds without rales or wheezing.  Abd:  Soft, NT, ND, + BS Spine nontender LE's without tender joint/muscle or effusions, no rash or swelling Neurological: Pt is alert. Not confused , motor grossly intact Skin: Skin is warm. No rash, no LE edema Psychiatric: Pt behavior is normal. No agitation.     Assessment & Plan:

## 2015-08-21 NOTE — Progress Notes (Signed)
Pre visit review using our clinic review tool, if applicable. No additional management support is needed unless otherwise documented below in the visit note. 

## 2015-08-21 NOTE — Patient Instructions (Signed)
.  You had the steroid shot today   Please take all new medication as prescribed- the muscle relaxer, prednisone, and anti-inflammatory  Please consider seeing Dr Katrinka BlazingSmith for the right arm pain (? Median nerve pain) if persists  Please continue all other medications as before, and refills have been done if requested.  Please have the pharmacy call with any other refills you may need.  Please keep your appointments with your specialists as you may have planned

## 2015-08-22 ENCOUNTER — Encounter: Payer: Self-pay | Admitting: Family Medicine

## 2015-08-22 ENCOUNTER — Ambulatory Visit (HOSPITAL_BASED_OUTPATIENT_CLINIC_OR_DEPARTMENT_OTHER)
Admission: RE | Admit: 2015-08-22 | Discharge: 2015-08-22 | Disposition: A | Discharge status: Home / Self Care | Payer: BLUE CROSS/BLUE SHIELD | Source: Ambulatory Visit | Attending: Orthopedic Surgery | Admitting: Orthopedic Surgery

## 2015-08-22 ENCOUNTER — Ambulatory Visit (HOSPITAL_BASED_OUTPATIENT_CLINIC_OR_DEPARTMENT_OTHER): Payer: BLUE CROSS/BLUE SHIELD | Admitting: Anesthesiology

## 2015-08-22 ENCOUNTER — Other Ambulatory Visit: Payer: Self-pay | Admitting: Orthopedic Surgery

## 2015-08-22 ENCOUNTER — Encounter (HOSPITAL_BASED_OUTPATIENT_CLINIC_OR_DEPARTMENT_OTHER): Payer: Self-pay | Admitting: *Deleted

## 2015-08-22 ENCOUNTER — Ambulatory Visit (INDEPENDENT_AMBULATORY_CARE_PROVIDER_SITE_OTHER): Payer: BLUE CROSS/BLUE SHIELD | Admitting: Family Medicine

## 2015-08-22 ENCOUNTER — Encounter (HOSPITAL_BASED_OUTPATIENT_CLINIC_OR_DEPARTMENT_OTHER)
Admission: RE | Disposition: A | Discharge status: Home / Self Care | Payer: Self-pay | Source: Ambulatory Visit | Attending: Orthopedic Surgery

## 2015-08-22 VITALS — BP 102/62 | HR 65 | Temp 98.1°F | Ht 63.0 in | Wt 136.3 lb

## 2015-08-22 DIAGNOSIS — Z881 Allergy status to other antibiotic agents status: Secondary | ICD-10-CM | POA: Insufficient documentation

## 2015-08-22 DIAGNOSIS — B9561 Methicillin susceptible Staphylococcus aureus infection as the cause of diseases classified elsewhere: Secondary | ICD-10-CM | POA: Diagnosis not present

## 2015-08-22 DIAGNOSIS — M6510 Other infective (teno)synovitis, unspecified site: Secondary | ICD-10-CM | POA: Diagnosis not present

## 2015-08-22 DIAGNOSIS — M65142 Other infective (teno)synovitis, left hand: Secondary | ICD-10-CM | POA: Diagnosis not present

## 2015-08-22 DIAGNOSIS — L0889 Other specified local infections of the skin and subcutaneous tissue: Secondary | ICD-10-CM | POA: Diagnosis not present

## 2015-08-22 DIAGNOSIS — L03012 Cellulitis of left finger: Secondary | ICD-10-CM

## 2015-08-22 DIAGNOSIS — M795 Residual foreign body in soft tissue: Secondary | ICD-10-CM | POA: Diagnosis not present

## 2015-08-22 DIAGNOSIS — Z79899 Other long term (current) drug therapy: Secondary | ICD-10-CM | POA: Diagnosis not present

## 2015-08-22 DIAGNOSIS — L02512 Cutaneous abscess of left hand: Secondary | ICD-10-CM | POA: Diagnosis not present

## 2015-08-22 DIAGNOSIS — M79645 Pain in left finger(s): Secondary | ICD-10-CM | POA: Diagnosis not present

## 2015-08-22 HISTORY — DX: Myoneural disorder, unspecified: G70.9

## 2015-08-22 HISTORY — PX: I & D EXTREMITY: SHX5045

## 2015-08-22 HISTORY — DX: Other specified health status: Z78.9

## 2015-08-22 SURGERY — IRRIGATION AND DEBRIDEMENT EXTREMITY
Anesthesia: General | Laterality: Left

## 2015-08-22 MED ORDER — FENTANYL CITRATE (PF) 100 MCG/2ML IJ SOLN
INTRAMUSCULAR | Status: AC
  Filled 2015-08-22: qty 2

## 2015-08-22 MED ORDER — CEFAZOLIN SODIUM-DEXTROSE 2-4 GM/100ML-% IV SOLN
INTRAVENOUS | Status: AC
  Filled 2015-08-22: qty 100

## 2015-08-22 MED ORDER — ONDANSETRON HCL 4 MG/2ML IJ SOLN
INTRAMUSCULAR | Status: AC
  Filled 2015-08-22: qty 2

## 2015-08-22 MED ORDER — DEXAMETHASONE SODIUM PHOSPHATE 10 MG/ML IJ SOLN
INTRAMUSCULAR | Status: AC
  Filled 2015-08-22: qty 1

## 2015-08-22 MED ORDER — ONDANSETRON HCL 4 MG/2ML IJ SOLN
INTRAMUSCULAR | Status: DC | PRN
Start: 2015-08-22 — End: 2015-08-22
  Administered 2015-08-22: 4 mg via INTRAVENOUS

## 2015-08-22 MED ORDER — MIDAZOLAM HCL 2 MG/2ML IJ SOLN
INTRAMUSCULAR | Status: AC
  Filled 2015-08-22: qty 2

## 2015-08-22 MED ORDER — SULFAMETHOXAZOLE-TRIMETHOPRIM 800-160 MG PO TABS: 1.0000 | ORAL_TABLET | Freq: Two times a day (BID) | ORAL | Status: DC

## 2015-08-22 MED ORDER — MIDAZOLAM HCL 2 MG/2ML IJ SOLN: 0.5000 mg | Freq: Once | INTRAMUSCULAR | Status: DC | PRN

## 2015-08-22 MED ORDER — CEFAZOLIN SODIUM-DEXTROSE 2-3 GM-% IV SOLR
INTRAVENOUS | Status: DC | PRN
  Administered 2015-08-22: 2 g via INTRAVENOUS

## 2015-08-22 MED ORDER — MIDAZOLAM HCL 5 MG/5ML IJ SOLN
INTRAMUSCULAR | Status: DC | PRN
  Administered 2015-08-22: 2 mg via INTRAVENOUS

## 2015-08-22 MED ORDER — BUPIVACAINE HCL (PF) 0.25 % IJ SOLN
INTRAMUSCULAR | Status: DC | PRN
  Administered 2015-08-22: 10 mL

## 2015-08-22 MED ORDER — PROPOFOL 10 MG/ML IV BOLUS
INTRAVENOUS | Status: DC | PRN
  Administered 2015-08-22: 200 mg via INTRAVENOUS

## 2015-08-22 MED ORDER — HYDROCODONE-ACETAMINOPHEN 5-325 MG PO TABS: ORAL_TABLET | ORAL | Status: DC

## 2015-08-22 MED ORDER — DEXAMETHASONE SODIUM PHOSPHATE 10 MG/ML IJ SOLN
INTRAMUSCULAR | Status: DC | PRN
  Administered 2015-08-22: 4 mg via INTRAVENOUS

## 2015-08-22 MED ORDER — FENTANYL CITRATE (PF) 100 MCG/2ML IJ SOLN
INTRAMUSCULAR | Status: DC | PRN
  Administered 2015-08-22 (×2): 50 ug via INTRAVENOUS

## 2015-08-22 MED ORDER — LACTATED RINGERS IV SOLN
INTRAVENOUS | Status: DC | PRN
  Administered 2015-08-22: 15:00:00 via INTRAVENOUS
  Administered 2015-08-22: 1000 mL

## 2015-08-22 MED ORDER — CHLORHEXIDINE GLUCONATE 4 % EX LIQD: 60.0000 mL | Freq: Once | CUTANEOUS | Status: DC

## 2015-08-22 MED ORDER — MEPERIDINE HCL 25 MG/ML IJ SOLN: 6.2500 mg | INTRAMUSCULAR | Status: DC | PRN

## 2015-08-22 MED ORDER — BUPIVACAINE HCL (PF) 0.25 % IJ SOLN
INTRAMUSCULAR | Status: AC
  Filled 2015-08-22: qty 30

## 2015-08-22 MED ORDER — FENTANYL CITRATE (PF) 100 MCG/2ML IJ SOLN: 25.0000 ug | INTRAMUSCULAR | Status: DC | PRN

## 2015-08-22 MED ORDER — PROMETHAZINE HCL 25 MG/ML IJ SOLN: 6.2500 mg | INTRAMUSCULAR | Status: DC | PRN

## 2015-08-22 MED ORDER — LIDOCAINE HCL (CARDIAC) 20 MG/ML IV SOLN
INTRAVENOUS | Status: AC
  Filled 2015-08-22: qty 5

## 2015-08-22 MED ORDER — LIDOCAINE HCL (CARDIAC) 20 MG/ML IV SOLN
INTRAVENOUS | Status: DC | PRN
  Administered 2015-08-22: 40 mg via INTRAVENOUS

## 2015-08-22 MED ORDER — LIDOCAINE HCL (PF) 1 % IJ SOLN
INTRAMUSCULAR | Status: AC
  Filled 2015-08-22: qty 5

## 2015-08-22 SURGICAL SUPPLY — 55 items
BAG DECANTER FOR FLEXI CONT (MISCELLANEOUS) IMPLANT
BANDAGE ACE 3X5.8 VEL STRL LF (GAUZE/BANDAGES/DRESSINGS) ×2 IMPLANT
BLADE MINI RND TIP GREEN BEAV (BLADE) IMPLANT
BLADE SURG 15 STRL LF DISP TIS (BLADE) ×2 IMPLANT
BLADE SURG 15 STRL SS (BLADE) ×6
BNDG CMPR 9X4 STRL LF SNTH (GAUZE/BANDAGES/DRESSINGS) ×1
BNDG COHESIVE 1X5 TAN STRL LF (GAUZE/BANDAGES/DRESSINGS) IMPLANT
BNDG ELASTIC 2X5.8 VLCR STR LF (GAUZE/BANDAGES/DRESSINGS) IMPLANT
BNDG ESMARK 4X9 LF (GAUZE/BANDAGES/DRESSINGS) ×2 IMPLANT
BNDG GAUZE 1X2.1 STRL (MISCELLANEOUS) IMPLANT
BNDG GAUZE ELAST 4 BULKY (GAUZE/BANDAGES/DRESSINGS) ×2 IMPLANT
BRUSH SCRUB EZ PLAIN DRY (MISCELLANEOUS) ×3 IMPLANT
CHLORAPREP W/TINT 26ML (MISCELLANEOUS) ×3 IMPLANT
CORDS BIPOLAR (ELECTRODE) ×3 IMPLANT
COVER BACK TABLE 60X90IN (DRAPES) ×3 IMPLANT
COVER MAYO STAND STRL (DRAPES) ×3 IMPLANT
COVER SURGICAL LIGHT HANDLE (MISCELLANEOUS) ×2 IMPLANT
CUFF TOURNIQUET SINGLE 18IN (TOURNIQUET CUFF) ×3 IMPLANT
DRAPE EXTREMITY T 121X128X90 (DRAPE) ×3 IMPLANT
DRAPE OEC MINIVIEW 54X84 (DRAPES) ×2 IMPLANT
DRAPE SURG 17X23 STRL (DRAPES) ×3 IMPLANT
GAUZE PACKING IODOFORM 1/4X15 (GAUZE/BANDAGES/DRESSINGS) ×2 IMPLANT
GAUZE SPONGE 4X4 12PLY STRL (GAUZE/BANDAGES/DRESSINGS) ×3 IMPLANT
GAUZE XEROFORM 1X8 LF (GAUZE/BANDAGES/DRESSINGS) ×3 IMPLANT
GLOVE BIO SURGEON STRL SZ7.5 (GLOVE) ×3 IMPLANT
GLOVE BIOGEL PI IND STRL 7.0 (GLOVE) IMPLANT
GLOVE BIOGEL PI IND STRL 8 (GLOVE) ×1 IMPLANT
GLOVE BIOGEL PI INDICATOR 7.0 (GLOVE) ×2
GLOVE BIOGEL PI INDICATOR 8 (GLOVE) ×2
GLOVE ECLIPSE 6.5 STRL STRAW (GLOVE) ×2 IMPLANT
GLOVE SURG SS PI 7.0 STRL IVOR (GLOVE) ×2 IMPLANT
GOWN STRL REUS W/ TWL LRG LVL3 (GOWN DISPOSABLE) ×1 IMPLANT
GOWN STRL REUS W/TWL LRG LVL3 (GOWN DISPOSABLE) ×3
GOWN STRL REUS W/TWL XL LVL3 (GOWN DISPOSABLE) ×3 IMPLANT
LOOP VESSEL MAXI BLUE (MISCELLANEOUS) IMPLANT
NDL HYPO 25X1 1.5 SAFETY (NEEDLE) IMPLANT
NEEDLE HYPO 25X1 1.5 SAFETY (NEEDLE) ×3 IMPLANT
NS IRRIG 1000ML POUR BTL (IV SOLUTION) ×3 IMPLANT
PACK BASIN DAY SURGERY FS (CUSTOM PROCEDURE TRAY) ×3 IMPLANT
PAD CAST 3X4 CTTN HI CHSV (CAST SUPPLIES) IMPLANT
PADDING CAST ABS 4INX4YD NS (CAST SUPPLIES) ×2
PADDING CAST ABS COTTON 4X4 ST (CAST SUPPLIES) ×1 IMPLANT
PADDING CAST COTTON 3X4 STRL (CAST SUPPLIES) ×3
SPLINT PLASTER CAST XFAST 3X15 (CAST SUPPLIES) IMPLANT
SPLINT PLASTER XTRA FASTSET 3X (CAST SUPPLIES) ×20
STOCKINETTE 4X48 STRL (DRAPES) ×3 IMPLANT
SUT ETHILON 4 0 PS 2 18 (SUTURE) IMPLANT
SWAB COLLECTION DEVICE MRSA (MISCELLANEOUS) ×2 IMPLANT
SWAB CULTURE ESWAB REG 1ML (MISCELLANEOUS) ×2 IMPLANT
SYR BULB 3OZ (MISCELLANEOUS) ×3 IMPLANT
SYR CONTROL 10ML LL (SYRINGE) ×2 IMPLANT
TOWEL OR 17X24 6PK STRL BLUE (TOWEL DISPOSABLE) ×6 IMPLANT
TRAY DSU PREP LF (CUSTOM PROCEDURE TRAY) ×3 IMPLANT
TUBE FEEDING 5FR 15 INCH (TUBING) ×2 IMPLANT
UNDERPAD 30X30 (UNDERPADS AND DIAPERS) ×3 IMPLANT

## 2015-08-22 NOTE — Anesthesia Preprocedure Evaluation (Addendum)
Anesthesia Evaluation  Patient identified by MRN, date of birth, ID band Patient awake    Reviewed: Allergy & Precautions, NPO status , Patient's Chart, lab work & pertinent test results  History of Anesthesia Complications Negative for: history of anesthetic complications  Airway Mallampati: II  TM Distance: >3 FB Neck ROM: Full    Dental  (+) Dental Advisory Given   Pulmonary neg pulmonary ROS,    breath sounds clear to auscultation       Cardiovascular negative cardio ROS   Rhythm:Regular Rate:Normal     Neuro/Psych DDD/back pain    GI/Hepatic negative GI ROS, Neg liver ROS,   Endo/Other  negative endocrine ROS  Renal/GU negative Renal ROS     Musculoskeletal   Abdominal   Peds  Hematology negative hematology ROS (+)   Anesthesia Other Findings   Reproductive/Obstetrics Pt reports she is menopausal                          Anesthesia Physical Anesthesia Plan  ASA: II  Anesthesia Plan: General   Post-op Pain Management:    Induction: Intravenous  Airway Management Planned: LMA  Additional Equipment:   Intra-op Plan:   Post-operative Plan:   Informed Consent: I have reviewed the patients History and Physical, chart, labs and discussed the procedure including the risks, benefits and alternatives for the proposed anesthesia with the patient or authorized representative who has indicated his/her understanding and acceptance.   Dental advisory given  Plan Discussed with: CRNA and Surgeon  Anesthesia Plan Comments: (Consent by Acey Lavarignan, MD)        Anesthesia Quick Evaluation

## 2015-08-22 NOTE — Anesthesia Procedure Notes (Signed)
Procedure Name: LMA Insertion Date/Time: 08/22/2015 3:49 PM Performed by: Curly ShoresRAFT, Cornella Emmer W Pre-anesthesia Checklist: Patient identified, Emergency Drugs available, Suction available and Patient being monitored Patient Re-evaluated:Patient Re-evaluated prior to inductionOxygen Delivery Method: Circle System Utilized Preoxygenation: Pre-oxygenation with 100% oxygen Intubation Type: IV induction Ventilation: Mask ventilation without difficulty LMA: LMA inserted LMA Size: 4.0 Number of attempts: 1 Airway Equipment and Method: Bite block Placement Confirmation: positive ETCO2 and breath sounds checked- equal and bilateral Tube secured with: Tape Dental Injury: Teeth and Oropharynx as per pre-operative assessment

## 2015-08-22 NOTE — Op Note (Signed)
NAMEElita Quick NO.:  [id number]  MEDICAL RECORD NO.:  [id number]  LOCATION:                                 FACILITY:  PHYSICIAN:  Betha Loa, MD             DATE OF BIRTH:  DATE OF PROCEDURE:  08/22/2015 DATE OF DISCHARGE:                              OPERATIVE REPORT   PREOPERATIVE DIAGNOSIS:  Left thumb infection with possible flexor sheath infection.  POSTOPERATIVE DIAGNOSIS:  Left thumb flexor sheath infection.  PROCEDURE:  Left thumb incision and drainage of the flexor sheath and removal of radiopaque foreign body.  SURGEON:  Betha Loa, MD.  ASSISTANT:  None.  ANESTHESIA:  General IV.  FLUIDS:  Per anesthesia flow sheet.  ESTIMATED BLOOD LOSS:  Minimal.  COMPLICATIONS:  None.  SPECIMENS:  Cultures to Micro.  TOURNIQUET TIME:  25 minutes.  DISPOSITION:  Stable to PACU.  INDICATIONS:  The patient is a 45 year old right-hand-dominant female who two days ago was working in her basement and yesterday began to have increased pain, swelling, and erythema of the thumb.  She is seen by her primary care physician today and referred for further evaluation and care.  On examination, she had a swollen erythematous thumb that was tender to palpation volarly at the proximal and distal phalanges.  There was a small wound at the ulnar side of the IP joint.  I recommended incision and drainage, possibly including the tendon sheath.  Risks, benefits, and alternatives of the surgery were discussed, including the risks of blood loss; infection; damage to nerves, vessels, tendons, ligaments, bone; failure of surgery; need for additional surgery; complications with wound healing; continued pain; continued infection; need for repeat irrigation and debridement.  She voiced understanding of these risks and elected to proceed.  OPERATIVE COURSE:  After being identified preoperatively by myself, the patient and I agreed upon the procedure and the site of  procedure. Surgical site was marked.  The risks, benefits, and alternatives of surgery were reviewed; and she wished to proceed.  Surgical consent had been signed.  She was transferred to the operating room and placed on the operating room table in supine position with the left upper extremity on arm board.  General anesthesia was induced by Anesthesiology.  Left upper extremity was prepped and draped in normal sterile orthopedic fashion.  Surgical pause was performed between surgeons, Anesthesia, and operating room staff; and all were in agreement as to the patient, procedure, and site of procedure. Tourniquet at the proximal aspect of the extremity was inflated to 250 mmHg after gravity exsanguination of the thumb and Esmarch exsanguination the hand and forearm.  The wound at the ulnar side of the IP joint was spread.  There was gross purulence.  Cultures were taken for aerobes and anaerobes.  A small piece of what appeared to be metal was removed.  C-arm images were taken later in AP and lateral projections to ensure appropriate removal of the radiopaque foreign body which was the Cogliano.  The wound was spread open.  An incision was made at the volar  aspect of the distal phalanx and carried into subcutaneous tissues by spreading technique.  Bipolar electrocautery was used to obtain hemostasis.  The flexor sheath was opened distally.  The thumb was milked.  Some clear fluid came out from the sheath.  There was no gross purulence.  The septae of the pad of the thumb were separated.  An incision was made at the volar aspect of the proximal flexion crease of the thumb and carried into the subcutaneous tissues by spreading technique.  The radial and ulnar digital nerves were identified and protected.  A small rent was made at the proximal aspect of the A1 pulley.  A #5 pediatric feeding tube was placed into the tendon sheath, and the sheath was copiously irrigated with sterile saline.   Good effluent was obtained from both the proximal and distal wounds.  The wounds were then all packed with quarter-inch iodoform gauze including the traumatic wound.  A digital block was performed with 10 mL of 0.25% plain Marcaine to aid in postoperative analgesia.  The wounds were dressed with sterile 4x4s and wrapped with a Kerlix bandage.  A thumb spica splint was placed and wrapped with Kerlix and Ace bandage.  The tourniquet was deflated at 25 minutes.  Fingertips were pink with brisk capillary refill after deflation of the tourniquet.  Operative drapes were broken down.  The patient was awoken from anesthesia safely.  She was given a dose of IV Ancef after cultures had been taken.  She was transferred back to a stretcher and taken to PACU in stable condition. I will see her back in the office tomorrow for removal of the pediatric feeding tube drain.  I will give her a prescription for Bactrim DS 1 p.o. b.i.d. x7 days and Norco 5/325, 1-2 p.o. q.6 hours p.r.n. pain, dispensed #30.     Betha LoaKevin Domenique Quest, MD     KK/MEDQ  D:  08/22/2015  T:  08/22/2015  Job:  [id number]

## 2015-08-22 NOTE — Op Note (Signed)
[id number] 

## 2015-08-22 NOTE — Op Note (Addendum)
Intra-operative fluoroscopic images in the AP, lateral, and oblique views were taken and evaluated by myself.  Removal of radioopaque foreign body was confirmed. 

## 2015-08-22 NOTE — Brief Op Note (Signed)
08/22/2015  4:25 PM  PATIENT:  Angela Webb  45 y.o. female  PRE-OPERATIVE DIAGNOSIS:  infected left thumb  POST-OPERATIVE DIAGNOSIS:  Left thumb flexor sheath infection  PROCEDURE:  Procedure(s) with comments: IRRIGATION AND DEBRIDEMENT EXTREMITY (Left) - I & d left thumb  SURGEON:  Surgeon(s) and Role:    * Betha LoaKevin Binta Statzer, MD - Primary  PHYSICIAN ASSISTANT:   ASSISTANTS: none   ANESTHESIA:   general  EBL:  Total I/O In: 800 [I.V.:800] Out: 1 [Blood:1]  BLOOD ADMINISTERED:none  DRAINS: iodoform packing and pediatric feeding tube drain  LOCAL MEDICATIONS USED:  MARCAINE     SPECIMEN:  Source of Specimen:  left thumb  DISPOSITION OF SPECIMEN:  micro  COUNTS:  YES  TOURNIQUET:   Total Tourniquet Time Documented: Upper Arm (Left) - 25 minutes Total: Upper Arm (Left) - 25 minutes   DICTATION: .Other Dictation: Dictation Number [id number]  PLAN OF CARE: Discharge to home after PACU  PATIENT DISPOSITION:  PACU - hemodynamically stable.

## 2015-08-22 NOTE — Progress Notes (Signed)
Pre visit review using our clinic review tool, if applicable. No additional management support is needed unless otherwise documented below in the visit note. 

## 2015-08-22 NOTE — Transfer of Care (Signed)
Immediate Anesthesia Transfer of Care Note  Patient: Angela Webb  Procedure(s) Performed: Procedure(s) with comments: IRRIGATION AND DEBRIDEMENT EXTREMITY (Left) - I & d left thumb  Patient Location: PACU  Anesthesia Type:General  Level of Consciousness: awake, alert  and patient cooperative  Airway & Oxygen Therapy: Patient Spontanous Breathing and Patient connected to face mask oxygen  Post-op Assessment: Report given to RN, Post -op Vital signs reviewed and stable and Patient moving all extremities  Post vital signs: Reviewed and stable  Last Vitals:  Filed Vitals:   08/22/15 1449  BP: 120/79  Pulse: 68  Temp: 36.9 C    Complications: No apparent anesthesia complications

## 2015-08-22 NOTE — Anesthesia Postprocedure Evaluation (Signed)
Anesthesia Post Note  Patient: Angela Webb  Procedure(s) Performed: Procedure(s) (LRB): IRRIGATION AND DEBRIDEMENT EXTREMITY (Left)  Patient location during evaluation: PACU Anesthesia Type: General Level of consciousness: awake and alert, oriented and patient cooperative Pain management: pain level controlled Vital Signs Assessment: post-procedure vital signs reviewed and stable Respiratory status: spontaneous breathing, nonlabored ventilation and respiratory function stable Cardiovascular status: blood pressure returned to baseline and stable Postop Assessment: no signs of nausea or vomiting Anesthetic complications: no    Last Vitals:  Filed Vitals:   08/22/15 1633 08/22/15 1645  BP: 105/64 105/64  Pulse: 72 63  Temp: 36.5 C   Resp: 12 14    Last Pain:  Filed Vitals:   08/22/15 1646  PainSc: 0-No pain                 Lanyah Spengler,E. Alaia Lordi

## 2015-08-22 NOTE — Progress Notes (Signed)
HPI:  Angela Webb is a pleasant 45 year old, patient of Dr. Jonny Webb, with a past medical history of acid reflux, depression, allergic rhinitis, neuropathy and hyperlipidemia here for an acute visit for:  Left thumb pain and swelling: -Started late last night, but swelling and redness pregressed acutely this morning -Was cleaning in the basement yesterday and thinks she nicked this thumb on something, husband took an exacto knife and tried to scrape out area that was irritated in [place] there was a splinter, he did not see a splinter - Now entire thumb is swollen, red, warm and painful with a throbbing sensation -Denies: Insect bites, stings, fever, malaise, numbness or weakness of the thumb  -She ate breakfast at 9:30 this morning -She has not taken anything for pain  -It looks like she saw her primary care doctor yesterday, but no notes are in the system -Reports this was for another issue.   ROS: See pertinent positives and negatives per HPI.  Past Medical History  Diagnosis Date  . Allergic rhinitis   . Hyperlipidemia   . ALLERGIC RHINITIS 10/03/2007  . HYPERLIPIDEMIA 08/08/2008    No past surgical history on file.  Family History  Problem Relation Age of Onset  . Arthritis Mother   . Diabetes Brother     type I, deceased    Social History   Social History  . Marital Status: Married    Spouse Name: N/A  . Number of Children: 1  . Years of Education: N/A   Occupational History  . customer service    Social History Main Topics  . Smoking status: Never Smoker   . Smokeless tobacco: None  . Alcohol Use: No  . Drug Use: No  . Sexual Activity: Not Asked   Other Topics Concern  . None   Social History Narrative     Current outpatient prescriptions:  .  celecoxib (CELEBREX) 200 MG capsule, 1 by mouth twice per day as needed, Disp: 60 capsule, Rfl: 5 .  cetirizine (ZYRTEC) 10 MG tablet, Take 1 tablet (10 mg total) by mouth daily., Disp: 90 tablet, Rfl: 3 .   cyclobenzaprine (FLEXERIL) 5 MG tablet, Take 1 tablet (5 mg total) by mouth daily., Disp: 30 tablet, Rfl: 5 .  fluticasone (FLONASE) 50 MCG/ACT nasal spray, Place 2 sprays into both nostrils daily., Disp: 16 g, Rfl: 2 .  gabapentin (NEURONTIN) 100 MG capsule, TAKE 2 CAPSULES (200 MG TOTAL) BY MOUTH AT BEDTIME., Disp: 60 capsule, Rfl: 1 .  Ibuprofen (ADVIL PO), Take by mouth., Disp: , Rfl:  .  pantoprazole (PROTONIX) 40 MG tablet, Take 1 tablet (40 mg total) by mouth daily., Disp: 90 tablet, Rfl: 3 .  predniSONE (DELTASONE) 10 MG tablet, 3 tabs by mouth per day for 3 days,2tabs per day for 3 days,1per day for 3 days, Disp: 18 tablet, Rfl: 0 .  simvastatin (ZOCOR) 40 MG tablet, TAKE ONE TABLET BY MOUTH DAILY AT 6 PM, Disp: 90 tablet, Rfl: 2 .  escitalopram (LEXAPRO) 10 MG tablet, Take 1 tablet (10 mg total) by mouth daily., Disp: 90 tablet, Rfl: 3 .  esomeprazole (NEXIUM) 40 MG capsule, Take 1 capsule (40 mg total) by mouth daily., Disp: 90 capsule, Rfl: 3  EXAM:  Filed Vitals:   08/22/15 1022  BP: 102/62  Pulse: 65  Temp: 98.1 F (36.7 C)    Body mass index is 24.15 kg/(m^2).  GENERAL: vitals reviewed and listed above, alert, oriented, appears well hydrated, she is tearful due to the  pain  HEENT: atraumatic, conjunttiva clear, no obvious abnormalities on inspection of external nose and ears  NECK: no obvious masses on inspection  LUNGS: clear to auscultation bilaterally, no wheezes, rales or rhonchi, good air movement  CV: HRRR, no peripheral edema  MS/SKIN: moves all extremities without noticeable abnormality except where noted, small punctate wound with small amount of overlying crust left medial thumb over IP jt; swelling, warmth and redness of the entire thumb with what appears to be some streaking into the dorsal hand, no appreciable abscess or fluctuant area, no necrotic tissue, no drainage. Limited ROM in the L thumb due to swelling. Cap refill normal.  PSYCH: pleasant and  cooperative, no obvious depression or anxiety  ASSESSMENT AND PLAN:  Discussed the following assessment and plan:  Thumb pain, left - Plan: Ambulatory referral to Hand Surgery  Cellulitis of finger of left hand - Plan: Ambulatory referral to Hand Surgery  -we discussed possible serious and likely etiologies, workup and treatment, treatment risks and return precautions - suspect bacterial infectious process, given rapid progression and degree of symptoms advised specialist evaluation vs abx and prompt re-eval or ED evaluation if symptoms not regressing promptly or worsen. They wish to see a specialist which I do think would be best given she is tearful due to the pain and the rapid progression of symptoms. I do not appreciate signs suggestive of foreign body (though may have small splinter), insect bite or abscess, however, wound exploration/labs/imaging may be warranted, Referral placed and scheduler working on appointment details. -of course, we advised Angela Webb  to return or notify a doctor immediately if symptoms worsen or persist or new concerns arise.  NOTE: Patient to be seen by specialist today. Appointment details provided and patient will go there from here.  -Patient advised to return or notify a doctor immediately if symptoms worsen or persist or new concerns arise.  There are no Patient Instructions on file for this visit.   Angela BasqueKIM, Angela Webb.

## 2015-08-22 NOTE — Discharge Instructions (Addendum)

## 2015-08-22 NOTE — H&P (Addendum)
Referring physician: Hannah Kim, DO Angela Webb is an 44 y.o. female.   Chief Complaint: left thumb infection HPI: 44 yo rhd female states she was working in basement 2 days ago and yesterday began to have increasing swelling and pain in left thumb.  Does not remember any specific injury.  No fevers, chills, night sweats.  Throbbing pain of 6/10 severity.  Aggravated and alleviated by nothing.  XR of thumb show small radioopaque foreign body on ulnar side of thumb.  Allergies:  Allergies  Allergen Reactions  . Levofloxacin Anaphylaxis    Past Medical History  Diagnosis Date  . Medical history non-contributory   . Neuromuscular disorder (HCC)     DDD    Past Surgical History  Procedure Laterality Date  . No past surgeries      Family History: History reviewed. No pertinent family history.  Social History:   reports that she has never smoked. She has never used smokeless tobacco. She reports that she drinks about 0.6 oz of alcohol per week. She reports that she does not use illicit drugs.  Medications: Medications Prior to Admission  Medication Sig Dispense Refill  . celecoxib (CELEBREX) 200 MG capsule Take 200 mg by mouth daily.    . cetirizine (ZYRTEC) 10 MG tablet Take 10 mg by mouth daily.    . gabapentin (NEURONTIN) 100 MG capsule Take 200 mg by mouth 1 day or 1 dose.    . simvastatin (ZOCOR) 10 MG tablet Take 10 mg by mouth daily at 6 PM.    . tiZANidine (ZANAFLEX) 4 MG capsule Take 4 mg by mouth 3 (three) times daily as needed for muscle spasms.      No results found for this or any previous visit (from the past 48 hour(s)).  No results found.   A comprehensive review of systems was negative.  Blood pressure 120/79, pulse 68, temperature 98.4 F (36.9 C), temperature source Oral, height 5' 4" (1.626 m), SpO2 100 %.  General appearance: alert, cooperative and appears stated age Head: Normocephalic, without obvious abnormality, atraumatic Neck: supple,  symmetrical, trachea midline Resp: clear to auscultation bilaterally Cardio: regular rate and rhythm GI: non-tender Extremities: Intact sensation and capillary refill all digits.  +epl/fpl/io.  TTP volarly on left thumb.  Able to lightly flex and extend ip joint.  ttp over proximal and distal phalanges and at mp joint.  Swollen and erythematous.  Small puncture wound at ulnar side of thumb near ip joint.  Pad of thumb swollen and tense. Pulses: 2+ and symmetric Skin: Skin color, texture, turgor normal. No rashes or lesions Neurologic: Grossly normal Incision/Wound:as above  Assessment/Plan Left thumb infection possibly involving flexor tendon sheath.  Recommend OR for incision and drainage possibly including flexor sheath.  Risks, benefits, and alternatives of surgery were discussed and the patient agrees with the plan of care.   Gal Feldhaus R 08/22/2015, 3:31 PM  Addendum (08/26/15): added referring physician.  

## 2015-08-23 ENCOUNTER — Encounter (HOSPITAL_BASED_OUTPATIENT_CLINIC_OR_DEPARTMENT_OTHER): Payer: Self-pay | Admitting: Orthopedic Surgery

## 2015-08-23 ENCOUNTER — Encounter: Payer: Self-pay | Admitting: Family Medicine

## 2015-08-23 NOTE — Assessment & Plan Note (Signed)
stable overall by history and exam, and pt to continue medical treatment as before,  to f/u any worsening symptoms or concerns 

## 2015-08-23 NOTE — Assessment & Plan Note (Signed)
Hx and exam c/w prob median neuritis, for nnsaid prn, predpac asd, f/u with Dr Smith/sport medicine

## 2015-08-23 NOTE — Assessment & Plan Note (Signed)
Mild to mod, for depomedrol IM, predpac asd,,  to f/u any worsening symptoms or concerns 

## 2015-08-23 NOTE — Assessment & Plan Note (Signed)
Exam benign, ? MSK, for trial flexeril prn, to f/u any worsening symptoms or concerns

## 2015-08-25 ENCOUNTER — Other Ambulatory Visit: Payer: Self-pay | Admitting: Internal Medicine

## 2015-08-25 LAB — WOUND CULTURE

## 2015-08-26 DIAGNOSIS — S61002D Unspecified open wound of left thumb without damage to nail, subsequent encounter: Secondary | ICD-10-CM | POA: Diagnosis not present

## 2015-08-26 DIAGNOSIS — M6510 Other infective (teno)synovitis, unspecified site: Secondary | ICD-10-CM | POA: Diagnosis not present

## 2015-08-26 DIAGNOSIS — M7989 Other specified soft tissue disorders: Secondary | ICD-10-CM | POA: Diagnosis not present

## 2015-08-26 DIAGNOSIS — M79645 Pain in left finger(s): Secondary | ICD-10-CM | POA: Diagnosis not present

## 2015-08-27 LAB — ANAEROBIC CULTURE

## 2015-08-28 ENCOUNTER — Other Ambulatory Visit: Payer: Self-pay | Admitting: Orthopedic Surgery

## 2015-08-28 DIAGNOSIS — M7989 Other specified soft tissue disorders: Secondary | ICD-10-CM | POA: Diagnosis not present

## 2015-08-28 DIAGNOSIS — M6510 Other infective (teno)synovitis, unspecified site: Secondary | ICD-10-CM | POA: Diagnosis not present

## 2015-08-28 DIAGNOSIS — S61002D Unspecified open wound of left thumb without damage to nail, subsequent encounter: Secondary | ICD-10-CM | POA: Diagnosis not present

## 2015-08-28 DIAGNOSIS — M79645 Pain in left finger(s): Secondary | ICD-10-CM | POA: Diagnosis not present

## 2015-08-29 ENCOUNTER — Ambulatory Visit (HOSPITAL_BASED_OUTPATIENT_CLINIC_OR_DEPARTMENT_OTHER): Payer: BLUE CROSS/BLUE SHIELD | Admitting: Anesthesiology

## 2015-08-29 ENCOUNTER — Encounter (HOSPITAL_BASED_OUTPATIENT_CLINIC_OR_DEPARTMENT_OTHER): Payer: Self-pay | Admitting: Anesthesiology

## 2015-08-29 ENCOUNTER — Ambulatory Visit (HOSPITAL_BASED_OUTPATIENT_CLINIC_OR_DEPARTMENT_OTHER)
Admission: RE | Admit: 2015-08-29 | Discharge: 2015-08-29 | Disposition: A | Discharge status: Home / Self Care | Payer: BLUE CROSS/BLUE SHIELD | Source: Ambulatory Visit | Attending: Orthopedic Surgery | Admitting: Orthopedic Surgery

## 2015-08-29 ENCOUNTER — Encounter (HOSPITAL_BASED_OUTPATIENT_CLINIC_OR_DEPARTMENT_OTHER)
Admission: RE | Disposition: A | Discharge status: Home / Self Care | Payer: Self-pay | Source: Ambulatory Visit | Attending: Orthopedic Surgery

## 2015-08-29 DIAGNOSIS — B9561 Methicillin susceptible Staphylococcus aureus infection as the cause of diseases classified elsewhere: Secondary | ICD-10-CM | POA: Diagnosis not present

## 2015-08-29 DIAGNOSIS — Z79899 Other long term (current) drug therapy: Secondary | ICD-10-CM | POA: Diagnosis not present

## 2015-08-29 DIAGNOSIS — L089 Local infection of the skin and subcutaneous tissue, unspecified: Secondary | ICD-10-CM | POA: Insufficient documentation

## 2015-08-29 DIAGNOSIS — L02512 Cutaneous abscess of left hand: Secondary | ICD-10-CM | POA: Diagnosis not present

## 2015-08-29 HISTORY — PX: INCISION AND DRAINAGE: SHX5863

## 2015-08-29 SURGERY — INCISION AND DRAINAGE
Anesthesia: General | Site: Thumb | Laterality: Left

## 2015-08-29 MED ORDER — LACTATED RINGERS IV SOLN
INTRAVENOUS | Status: DC
  Administered 2015-08-29: 11:00:00 via INTRAVENOUS
  Administered 2015-08-29: 10 mL/h via INTRAVENOUS

## 2015-08-29 MED ORDER — GLYCOPYRROLATE 0.2 MG/ML IJ SOLN: 0.2000 mg | Freq: Once | INTRAMUSCULAR | Status: DC | PRN

## 2015-08-29 MED ORDER — SCOPOLAMINE 1 MG/3DAYS TD PT72: 1.0000 | MEDICATED_PATCH | Freq: Once | TRANSDERMAL | Status: DC | PRN

## 2015-08-29 MED ORDER — ONDANSETRON HCL 4 MG/2ML IJ SOLN
INTRAMUSCULAR | Status: DC | PRN
  Administered 2015-08-29: 4 mg via INTRAVENOUS

## 2015-08-29 MED ORDER — LIDOCAINE HCL (CARDIAC) 20 MG/ML IV SOLN
INTRAVENOUS | Status: DC | PRN
  Administered 2015-08-29: 50 mg via INTRAVENOUS

## 2015-08-29 MED ORDER — FENTANYL CITRATE (PF) 100 MCG/2ML IJ SOLN
INTRAMUSCULAR | Status: AC
  Filled 2015-08-29: qty 2

## 2015-08-29 MED ORDER — CEFAZOLIN SODIUM-DEXTROSE 2-4 GM/100ML-% IV SOLN
INTRAVENOUS | Status: AC
  Filled 2015-08-29: qty 100

## 2015-08-29 MED ORDER — ONDANSETRON HCL 4 MG/2ML IJ SOLN: 4.0000 mg | Freq: Once | INTRAMUSCULAR | Status: DC | PRN

## 2015-08-29 MED ORDER — FENTANYL CITRATE (PF) 100 MCG/2ML IJ SOLN
50.0000 ug | INTRAMUSCULAR | Status: DC | PRN
  Administered 2015-08-29: 100 ug via INTRAVENOUS

## 2015-08-29 MED ORDER — DEXAMETHASONE SODIUM PHOSPHATE 10 MG/ML IJ SOLN
INTRAMUSCULAR | Status: DC | PRN
  Administered 2015-08-29: 10 mg via INTRAVENOUS

## 2015-08-29 MED ORDER — HYDROCODONE-ACETAMINOPHEN 7.5-325 MG PO TABS: 1.0000 | ORAL_TABLET | Freq: Once | ORAL | Status: DC | PRN

## 2015-08-29 MED ORDER — FENTANYL CITRATE (PF) 100 MCG/2ML IJ SOLN
25.0000 ug | INTRAMUSCULAR | Status: DC | PRN
  Administered 2015-08-29: 50 ug via INTRAVENOUS
  Administered 2015-08-29 (×3): 25 ug via INTRAVENOUS

## 2015-08-29 MED ORDER — BUPIVACAINE HCL (PF) 0.25 % IJ SOLN
INTRAMUSCULAR | Status: DC | PRN
  Administered 2015-08-29: 8 mL

## 2015-08-29 MED ORDER — CHLORHEXIDINE GLUCONATE 4 % EX LIQD: 60.0000 mL | Freq: Once | CUTANEOUS | Status: DC

## 2015-08-29 MED ORDER — VANCOMYCIN HCL IN DEXTROSE 1-5 GM/200ML-% IV SOLN
INTRAVENOUS | Status: AC
  Filled 2015-08-29: qty 200

## 2015-08-29 MED ORDER — CEFAZOLIN SODIUM-DEXTROSE 2-3 GM-% IV SOLR
INTRAVENOUS | Status: DC | PRN
  Administered 2015-08-29: 2 g via INTRAVENOUS

## 2015-08-29 MED ORDER — SULFAMETHOXAZOLE-TRIMETHOPRIM 800-160 MG PO TABS: 1.0000 | ORAL_TABLET | Freq: Two times a day (BID) | ORAL | Status: DC

## 2015-08-29 MED ORDER — PROPOFOL 10 MG/ML IV BOLUS
INTRAVENOUS | Status: DC | PRN
  Administered 2015-08-29: 200 mg via INTRAVENOUS

## 2015-08-29 MED ORDER — MIDAZOLAM HCL 2 MG/2ML IJ SOLN
1.0000 mg | INTRAMUSCULAR | Status: DC | PRN
  Administered 2015-08-29: 2 mg via INTRAVENOUS

## 2015-08-29 MED ORDER — HYDROCODONE-ACETAMINOPHEN 10-325 MG PO TABS: 1.0000 | ORAL_TABLET | Freq: Four times a day (QID) | ORAL | Status: DC | PRN

## 2015-08-29 SURGICAL SUPPLY — 56 items
BAG DECANTER FOR FLEXI CONT (MISCELLANEOUS) IMPLANT
BANDAGE ACE 3X5.8 VEL STRL LF (GAUZE/BANDAGES/DRESSINGS) IMPLANT
BLADE MINI RND TIP GREEN BEAV (BLADE) IMPLANT
BLADE SURG 15 STRL LF DISP TIS (BLADE) ×2 IMPLANT
BLADE SURG 15 STRL SS (BLADE) ×6
BNDG CMPR 9X4 STRL LF SNTH (GAUZE/BANDAGES/DRESSINGS) ×1
BNDG COHESIVE 1X5 TAN STRL LF (GAUZE/BANDAGES/DRESSINGS) IMPLANT
BNDG ELASTIC 2X5.8 VLCR STR LF (GAUZE/BANDAGES/DRESSINGS) IMPLANT
BNDG ESMARK 4X9 LF (GAUZE/BANDAGES/DRESSINGS) ×2 IMPLANT
BNDG GAUZE 1X2.1 STRL (MISCELLANEOUS) IMPLANT
BNDG GAUZE ELAST 4 BULKY (GAUZE/BANDAGES/DRESSINGS) IMPLANT
CHLORAPREP W/TINT 26ML (MISCELLANEOUS) ×3 IMPLANT
CORDS BIPOLAR (ELECTRODE) ×3 IMPLANT
COVER BACK TABLE 60X90IN (DRAPES) ×3 IMPLANT
COVER MAYO STAND STRL (DRAPES) ×3 IMPLANT
CUFF TOURNIQUET SINGLE 18IN (TOURNIQUET CUFF) ×3 IMPLANT
DRAPE EXTREMITY T 121X128X90 (DRAPE) ×3 IMPLANT
DRAPE SURG 17X23 STRL (DRAPES) ×3 IMPLANT
GAUZE PACKING IODOFORM 1/4X15 (GAUZE/BANDAGES/DRESSINGS) IMPLANT
GAUZE SPONGE 4X4 12PLY STRL (GAUZE/BANDAGES/DRESSINGS) ×3 IMPLANT
GAUZE XEROFORM 1X8 LF (GAUZE/BANDAGES/DRESSINGS) ×3 IMPLANT
GLOVE BIO SURGEON STRL SZ7.5 (GLOVE) ×3 IMPLANT
GLOVE BIOGEL PI IND STRL 7.0 (GLOVE) IMPLANT
GLOVE BIOGEL PI IND STRL 8 (GLOVE) ×1 IMPLANT
GLOVE BIOGEL PI IND STRL 8.5 (GLOVE) IMPLANT
GLOVE BIOGEL PI INDICATOR 7.0 (GLOVE) ×2
GLOVE BIOGEL PI INDICATOR 8 (GLOVE) ×2
GLOVE BIOGEL PI INDICATOR 8.5 (GLOVE) ×2
GLOVE SURG SS PI 6.5 STRL IVOR (GLOVE) ×2 IMPLANT
GLOVE SURG SYN 7.5  E (GLOVE) ×2
GLOVE SURG SYN 7.5 E (GLOVE) ×1 IMPLANT
GLOVE SURG SYN 7.5 PF PI (GLOVE) IMPLANT
GOWN STRL REUS W/ TWL LRG LVL3 (GOWN DISPOSABLE) ×1 IMPLANT
GOWN STRL REUS W/TWL LRG LVL3 (GOWN DISPOSABLE) ×6
GOWN STRL REUS W/TWL XL LVL3 (GOWN DISPOSABLE) ×5 IMPLANT
LOOP VESSEL MAXI BLUE (MISCELLANEOUS) IMPLANT
NDL HYPO 25X1 1.5 SAFETY (NEEDLE) IMPLANT
NEEDLE HYPO 25X1 1.5 SAFETY (NEEDLE) ×3 IMPLANT
NS IRRIG 1000ML POUR BTL (IV SOLUTION) ×3 IMPLANT
PACK BASIN DAY SURGERY FS (CUSTOM PROCEDURE TRAY) ×3 IMPLANT
PAD CAST 3X4 CTTN HI CHSV (CAST SUPPLIES) IMPLANT
PADDING CAST ABS 4INX4YD NS (CAST SUPPLIES)
PADDING CAST ABS COTTON 4X4 ST (CAST SUPPLIES) ×1 IMPLANT
PADDING CAST COTTON 3X4 STRL (CAST SUPPLIES)
SPLINT PLASTER CAST XFAST 3X15 (CAST SUPPLIES) IMPLANT
SPLINT PLASTER XTRA FASTSET 3X (CAST SUPPLIES)
STOCKINETTE 4X48 STRL (DRAPES) ×3 IMPLANT
SUT ETHILON 4 0 PS 2 18 (SUTURE) IMPLANT
SWAB COLLECTION DEVICE MRSA (MISCELLANEOUS) IMPLANT
SWAB CULTURE ESWAB REG 1ML (MISCELLANEOUS) IMPLANT
SYR 20CC LL (SYRINGE) IMPLANT
SYR BULB 3OZ (MISCELLANEOUS) ×3 IMPLANT
SYR CONTROL 10ML LL (SYRINGE) ×2 IMPLANT
TOWEL OR 17X24 6PK STRL BLUE (TOWEL DISPOSABLE) ×6 IMPLANT
TUBE FEEDING 5FR 15 INCH (TUBING) IMPLANT
UNDERPAD 30X30 (UNDERPADS AND DIAPERS) ×3 IMPLANT

## 2015-08-29 NOTE — Op Note (Signed)
NAME:  Angela Webb, Angela Webb                ACCOUNT NO.:  [id number]  MEDICAL RECORD NO.:  [id number]  LOCATION:                                 FACILITY:  PHYSICIAN:  Betha Loa, MD             DATE OF BIRTH:  DATE OF PROCEDURE:  08/29/2015 DATE OF DISCHARGE:                              OPERATIVE REPORT   PREOPERATIVE DIAGNOSIS:  Left thumb continued infection.  POSTOPERATIVE DIAGNOSIS:  Left thumb continued infection.  PROCEDURE:  Left thumb repeat incision and drainage subcutaneous abscess.  SURGEON:  Betha Loa, MD  ASSISTANT:  None.  ANESTHESIA:  General.  IV FLUIDS:  Per anesthesia flow sheet.  ESTIMATED BLOOD LOSS:  Minimal.  COMPLICATIONS:  None.  SPECIMENS:  Cultures to Micro.  TOURNIQUET TIME:  9 minutes.  DISPOSITION:  Stable to PACU.  INDICATIONS:  Ms. Dolloff is a 45 year old female who 1 week ago underwent incision and drainage of left thumb including flexor tendon sheath for infection.  She has done well, but has had continued erythema and purulent drainage from the ulnar-sided wound, which was the traumatic portion of the wound.  She has returned today for repeat incision and drainage.  Risks, benefits and alternatives of the surgery were discussed including the risk of blood loss; infection; damage to nerves, vessels, tendons, ligaments, bone; failure of surgery; need for additional surgery; complications with wound healing; continued pain; continued infection; need for repeat irrigation and debridement.  She voiced understanding of these risks and elected to proceed.  OPERATIVE COURSE:  After being identified preoperatively by myself, the patient and I agreed upon the procedure and site of procedure.  Surgical site was marked.  The risks, benefits, and alternatives of the surgery were reviewed and she wished to proceed.  Surgical consent had been signed.  She was given no antibiotics preoperatively for cultures.  She was transferred to the operating room  and placed on the operating table in supine position with left upper extremity on an armboard.  General anesthesia was induced by anesthesiologist.  Left upper extremity was prepped and draped in normal sterile orthopedic fashion.  A surgical pause was performed between the surgeons, anesthesia, and operating room staff, and all were in agreement as to the patient, procedure, and site of procedure.  Tourniquet at the proximal aspect of the extremity was inflated to 250 mmHg after exsanguination of the limb with an Esmarch bandage.  The wound was explored.  There was small amount of purulence reaccumulating.  The dried skin was peeled off.  There were some white speckled areas at the dorsal and proximal aspect of the wound.  The wound was extended both proximally and distally with the knife.  This was carried into subcutaneous tissues by spreading technique.  There was a small pocket that had formed.  There was minimal amount of purulence in this however.  Cultures had been taken.  All wounds were copiously irrigated with sterile saline and repacked with 0.25-inch iodoform gauze.  A digital block was performed with 8 mL of 0.25% plain Marcaine to aid in postoperative analgesia.  The wounds were then dressed with sterile 4x4s and wrapped  with a Kerlix bandage.  A thumb spica splint was placed and wrapped with Kerlix and Ace bandage.  Tourniquet was deflated at 9 minutes.  She was given a dose of IV Ancef after cultures had been taken.  She was transferred back to the stretcher and taken to PACU in stable condition.  I will see her back in the office on Monday in 4 days for continued wound care.  We will continue with Bactrim DS 1 p.o. b.i.d.  I will give her prescription for Percocet 10/325, 1-2 p.o. q.6 hours p.r.n. pain, dispensed #20 as the Percocet 5 did not provide her enough pain relief at her last surgical session.     Betha LoaKevin Dashawna Delbridge, MD     KK/MEDQ  D:  08/29/2015  T:   08/29/2015  Job:  [id number]

## 2015-08-29 NOTE — Anesthesia Procedure Notes (Signed)
Procedure Name: LMA Insertion Date/Time: 08/29/2015 11:56 AM Performed by: Zenia ResidesPAYNE, Charmelle Soh D Pre-anesthesia Checklist: Patient identified, Emergency Drugs available, Suction available and Patient being monitored Patient Re-evaluated:Patient Re-evaluated prior to inductionOxygen Delivery Method: Circle System Utilized Preoxygenation: Pre-oxygenation with 100% oxygen Intubation Type: IV induction Ventilation: Mask ventilation without difficulty LMA: LMA inserted LMA Size: 4.0 Number of attempts: 1 Airway Equipment and Method: Bite block Placement Confirmation: positive ETCO2 Tube secured with: Tape Dental Injury: Teeth and Oropharynx as per pre-operative assessment

## 2015-08-29 NOTE — Op Note (Signed)
[id number] 

## 2015-08-29 NOTE — H&P (View-Only) (Signed)
Referring physician: Kriste BasqueHannah Kim, DO Angela Webb is an 45 y.o. female.   Chief Complaint: left thumb infection HPI: 45 yo rhd female states she was working in basement 2 days ago and yesterday began to have increasing swelling and pain in left thumb.  Does not remember any specific injury.  No fevers, chills, night sweats.  Throbbing pain of 6/10 severity.  Aggravated and alleviated by nothing.  XR of thumb show small radioopaque foreign body on ulnar side of thumb.  Allergies:  Allergies  Allergen Reactions  . Levofloxacin Anaphylaxis    Past Medical History  Diagnosis Date  . Medical history non-contributory   . Neuromuscular disorder [facility](HCC)     DDD    Past Surgical History  Procedure Laterality Date  . No past surgeries      Family History: History reviewed. No pertinent family history.  Social History:   reports that she has never smoked. She has never used smokeless tobacco. She reports that she drinks about 0.6 oz of alcohol per week. She reports that she does not use illicit drugs.  Medications: Medications Prior to Admission  Medication Sig Dispense Refill  . celecoxib (CELEBREX) 200 MG capsule Take 200 mg by mouth daily.    . cetirizine (ZYRTEC) 10 MG tablet Take 10 mg by mouth daily.    Marland Kitchen. gabapentin (NEURONTIN) 100 MG capsule Take 200 mg by mouth 1 day or 1 dose.    . simvastatin (ZOCOR) 10 MG tablet Take 10 mg by mouth daily at 6 PM.    . tiZANidine (ZANAFLEX) 4 MG capsule Take 4 mg by mouth 3 (three) times daily as needed for muscle spasms.      No results found for this or any previous visit (from the past 48 hour(s)).  No results found.   A comprehensive review of systems was negative.  Blood pressure 120/79, pulse 68, temperature 98.4 F (36.9 C), temperature source Oral, height 5\' 4"  (1.626 m), SpO2 100 %.  General appearance: alert, cooperative and appears stated age Head: Normocephalic, without obvious abnormality, atraumatic Neck: supple,  symmetrical, trachea midline Resp: clear to auscultation bilaterally Cardio: regular rate and rhythm GI: non-tender Extremities: Intact sensation and capillary refill all digits.  +epl/fpl/io.  TTP volarly on left thumb.  Able to lightly flex and extend ip joint.  ttp over proximal and distal phalanges and at mp joint.  Swollen and erythematous.  Small puncture wound at ulnar side of thumb near ip joint.  Pad of thumb swollen and tense. Pulses: 2+ and symmetric Skin: Skin color, texture, turgor normal. No rashes or lesions Neurologic: Grossly normal Incision/Wound:as above  Assessment/Plan Left thumb infection possibly involving flexor tendon sheath.  Recommend OR for incision and drainage possibly including flexor sheath.  Risks, benefits, and alternatives of surgery were discussed and the patient agrees with the plan of care.   Sayer Masini R 08/22/2015, 3:31 PM  Addendum (08/26/15): added referring physician.

## 2015-08-29 NOTE — Discharge Instructions (Addendum)

## 2015-08-29 NOTE — Transfer of Care (Signed)
Immediate Anesthesia Transfer of Care Note  Patient: Angela Webb  Procedure(s) Performed: Procedure(s): REPEAT INCISION AND DRAINAGE LEFT THUMB (Left)  Patient Location: PACU  Anesthesia Type:General  Level of Consciousness: awake  Airway & Oxygen Therapy: Patient Spontanous Breathing and Patient connected to face mask oxygen  Post-op Assessment: Report given to RN and Post -op Vital signs reviewed and stable  Post vital signs: Reviewed and stable  Last Vitals:  Filed Vitals:   08/29/15 1050  BP: 102/50  Pulse: 56  Temp: 36.8 C  Resp: 18    Last Pain: There were no vitals filed for this visit.       Complications: No apparent anesthesia complications

## 2015-08-29 NOTE — Anesthesia Preprocedure Evaluation (Signed)
Anesthesia Evaluation  Patient identified by MRN, date of birth, ID band Patient awake    Reviewed: Allergy & Precautions, H&P , NPO status , Patient's Chart, lab work & pertinent test results  History of Anesthesia Complications Negative for: history of anesthetic complications  Airway Mallampati: II  TM Distance: >3 FB Neck ROM: full    Dental no notable dental hx. (+) Dental Advisory Given   Pulmonary neg pulmonary ROS,    Pulmonary exam normal breath sounds clear to auscultation       Cardiovascular negative cardio ROS Normal cardiovascular exam Rhythm:regular Rate:Normal     Neuro/Psych DDD/back pain negative neurological ROS     GI/Hepatic negative GI ROS, Neg liver ROS,   Endo/Other  negative endocrine ROS  Renal/GU negative Renal ROS     Musculoskeletal   Abdominal   Peds  Hematology negative hematology ROS (+)   Anesthesia Other Findings   Reproductive/Obstetrics negative OB ROS Pt reports she is menopausal                             Anesthesia Physical  Anesthesia Plan  ASA: II  Anesthesia Plan: General   Post-op Pain Management:    Induction: Intravenous  Airway Management Planned: LMA  Additional Equipment:   Intra-op Plan:   Post-operative Plan:   Informed Consent: I have reviewed the patients History and Physical, chart, labs and discussed the procedure including the risks, benefits and alternatives for the proposed anesthesia with the patient or authorized representative who has indicated his/her understanding and acceptance.   Dental Advisory Given  Plan Discussed with: Anesthesiologist, CRNA and Surgeon  Anesthesia Plan Comments: (Consent by Acey Lavarignan, MD)        Anesthesia Quick Evaluation

## 2015-08-29 NOTE — Brief Op Note (Signed)
08/29/2015  12:18 PM  PATIENT:  Angela Webb  45 y.o. female  PRE-OPERATIVE DIAGNOSIS:  LEFT THUMB INFECTION  POST-OPERATIVE DIAGNOSIS:  LEFT THUMB INFECTION  PROCEDURE:  Procedure(s): REPEAT INCISION AND DRAINAGE LEFT THUMB (Left)  SURGEON:  Surgeon(s) and Role:    * Betha LoaKevin Reveca Desmarais, MD - Primary  PHYSICIAN ASSISTANT:   ASSISTANTS: none   ANESTHESIA:   general  EBL:  Total I/O In: -  Out: 2 [Blood:2]  BLOOD ADMINISTERED:none  DRAINS: iodoform packing  LOCAL MEDICATIONS USED:  MARCAINE     SPECIMEN:  Source of Specimen:  left thumb  DISPOSITION OF SPECIMEN:  micro  COUNTS:  YES  TOURNIQUET:   Total Tourniquet Time Documented: Upper Arm (Left) - 9 minutes Total: Upper Arm (Left) - 9 minutes   DICTATION: .Other Dictation: Dictation Number [id number]  PLAN OF CARE: Discharge to home after PACU  PATIENT DISPOSITION:  PACU - hemodynamically stable.

## 2015-08-29 NOTE — Interval H&P Note (Signed)
History and Physical Interval Note: Continued erythema and purulent drainage from ulnar sided wound on thumb.  Plan repeat incision and drainage for management of infection. 08/29/2015 10:34 AM  Angela Webb  has presented today for surgery, with the diagnosis of LEFT THUMB INFECTION  The various methods of treatment have been discussed with the patient and family. After consideration of risks, benefits and other options for treatment, the patient has consented to  Procedure(s): REPEAT INCISION AND DRAINAGE LEFT THUMB (Left) as a surgical intervention .  The patient's history has been reviewed, patient examined, no change in status, stable for surgery.  I have reviewed the patient's chart and labs.  Questions were answered to the patient's satisfaction.     Angela Webb R

## 2015-08-29 NOTE — Anesthesia Postprocedure Evaluation (Signed)
Anesthesia Post Note  Patient: Angela Webb  Procedure(s) Performed: Procedure(s) (LRB): REPEAT INCISION AND DRAINAGE LEFT THUMB (Left)  Patient location during evaluation: PACU Anesthesia Type: General Level of consciousness: awake and alert Pain management: pain level controlled Vital Signs Assessment: post-procedure vital signs reviewed and stable Respiratory status: spontaneous breathing, nonlabored ventilation, respiratory function stable and patient connected to nasal cannula oxygen Cardiovascular status: blood pressure returned to baseline and stable Postop Assessment: no signs of nausea or vomiting Anesthetic complications: no    Last Vitals:  Filed Vitals:   08/29/15 1224 08/29/15 1230  BP: 87/57 93/55  Pulse: 62 64  Temp: 36.4 C   Resp: 20 13    Last Pain:  Filed Vitals:   08/29/15 1233  PainSc: 0-No pain                 [name], Montrice Gracey J

## 2015-08-30 ENCOUNTER — Encounter (HOSPITAL_BASED_OUTPATIENT_CLINIC_OR_DEPARTMENT_OTHER): Payer: Self-pay | Admitting: Orthopedic Surgery

## 2015-09-01 LAB — WOUND CULTURE

## 2015-09-02 DIAGNOSIS — M79645 Pain in left finger(s): Secondary | ICD-10-CM | POA: Diagnosis not present

## 2015-09-02 DIAGNOSIS — S61002D Unspecified open wound of left thumb without damage to nail, subsequent encounter: Secondary | ICD-10-CM | POA: Diagnosis not present

## 2015-09-03 LAB — ANAEROBIC CULTURE

## 2015-09-05 DIAGNOSIS — M79645 Pain in left finger(s): Secondary | ICD-10-CM | POA: Diagnosis not present

## 2015-09-05 DIAGNOSIS — S61002D Unspecified open wound of left thumb without damage to nail, subsequent encounter: Secondary | ICD-10-CM | POA: Diagnosis not present

## 2015-09-09 DIAGNOSIS — M7989 Other specified soft tissue disorders: Secondary | ICD-10-CM | POA: Diagnosis not present

## 2015-09-09 DIAGNOSIS — M79645 Pain in left finger(s): Secondary | ICD-10-CM | POA: Diagnosis not present

## 2015-09-09 DIAGNOSIS — S61002D Unspecified open wound of left thumb without damage to nail, subsequent encounter: Secondary | ICD-10-CM | POA: Diagnosis not present

## 2015-09-09 DIAGNOSIS — M6510 Other infective (teno)synovitis, unspecified site: Secondary | ICD-10-CM | POA: Diagnosis not present

## 2015-09-12 DIAGNOSIS — S61002D Unspecified open wound of left thumb without damage to nail, subsequent encounter: Secondary | ICD-10-CM | POA: Diagnosis not present

## 2015-09-12 DIAGNOSIS — M6510 Other infective (teno)synovitis, unspecified site: Secondary | ICD-10-CM | POA: Diagnosis not present

## 2015-09-12 DIAGNOSIS — M7989 Other specified soft tissue disorders: Secondary | ICD-10-CM | POA: Diagnosis not present

## 2015-10-09 ENCOUNTER — Other Ambulatory Visit: Payer: Self-pay

## 2015-10-09 MED ORDER — PANTOPRAZOLE SODIUM 40 MG PO TBEC: 40.0000 mg | DELAYED_RELEASE_TABLET | Freq: Every day | ORAL | Status: DC

## 2015-10-10 ENCOUNTER — Other Ambulatory Visit: Payer: Self-pay | Admitting: *Deleted

## 2015-10-10 DIAGNOSIS — Z6822 Body mass index (BMI) 22.0-22.9, adult: Secondary | ICD-10-CM | POA: Diagnosis not present

## 2015-10-10 DIAGNOSIS — Z01419 Encounter for gynecological examination (general) (routine) without abnormal findings: Secondary | ICD-10-CM | POA: Diagnosis not present

## 2015-10-10 MED ORDER — GABAPENTIN 100 MG PO CAPS: ORAL_CAPSULE | ORAL | Status: DC

## 2015-10-10 NOTE — Telephone Encounter (Signed)
Refill done.  Pt needs an appt for future refills.  

## 2015-11-14 ENCOUNTER — Other Ambulatory Visit: Payer: Self-pay | Admitting: Family Medicine

## 2015-11-14 NOTE — Telephone Encounter (Signed)
Refill denied. Pt needs an office visit for future refills.

## 2015-11-26 ENCOUNTER — Other Ambulatory Visit: Payer: Self-pay | Admitting: Family Medicine

## 2015-11-26 NOTE — Telephone Encounter (Signed)
Must be seen for refill

## 2016-04-02 DIAGNOSIS — L723 Sebaceous cyst: Secondary | ICD-10-CM | POA: Diagnosis not present

## 2016-04-25 DIAGNOSIS — M25511 Pain in right shoulder: Secondary | ICD-10-CM | POA: Diagnosis not present

## 2016-05-09 NOTE — Progress Notes (Deleted)
Tawana ScaleZach Analaura Messler D.O. [facility] [address] [place], [place] [address] Phone: [phone] Subjective:    I'm seeing this patient by the request  of:    CC:   BJY:[id number]:Subjective  Angela Webb is a 46 y.o. female coming in with complaint of ***   patient's previous imaging includes an MRI. Patient did have the distant day and wasn't apparently visualized by me showing severe degenerative disc disease at L5 S1 but otherwise fairly unremarkable.   Past Medical History:  Diagnosis Date  . Allergic rhinitis   . ALLERGIC RHINITIS 10/03/2007  . Hyperlipidemia   . HYPERLIPIDEMIA 08/08/2008  . Medical history non-contributory   . Neuromuscular disorder (HCC)    DDD   Past Surgical History:  Procedure Laterality Date  . I&D EXTREMITY Left 08/22/2015   Procedure: IRRIGATION AND DEBRIDEMENT EXTREMITY;  Surgeon: Betha LoaKevin Kuzma, MD;  Location: [facility];  Service: Orthopedics;  Laterality: Left;  I & d left thumb  . INCISION AND DRAINAGE Left 08/29/2015   Procedure: REPEAT INCISION AND DRAINAGE LEFT THUMB;  Surgeon: Betha LoaKevin Kuzma, MD;  Location: [facility];  Service: Orthopedics;  Laterality: Left;  . NO PAST SURGERIES     Social History   Social History  . Marital status: Married    Spouse name: N/A  . Number of children: 1  . Years of education: N/A   Occupational History  . customer service [employer]   Social History Main Topics  . Smoking status: Never Smoker  . Smokeless tobacco: Never Used  . Alcohol use 0.6 oz/week    1 Glasses of wine per week  . Drug use: No  . Sexual activity: Yes   Other Topics Concern  . Not on file   Social History Narrative   ** Merged History Encounter **       Allergies  Allergen Reactions  . Levofloxacin Anaphylaxis  . Levofloxacin     REACTION: LEG PAIN/CRAMPS  . Tramadol Nausea Only   Family History  Problem Relation Age of Onset  . Arthritis Mother   . Diabetes Brother     type I, deceased     Past medical history, social, surgical and family history all reviewed in electronic medical record.  No pertanent information unless stated regarding to the chief complaint.   Review of Systems:Review of systems updated and as accurate as of 05/09/16  No headache, visual changes, nausea, vomiting, diarrhea, constipation, dizziness, abdominal pain, skin rash, fevers, chills, night sweats, weight loss, swollen lymph nodes, body aches, joint swelling, muscle aches, chest pain, shortness of breath, mood changes.   Objective  There were no vitals taken for this visit. Systems examined below as of 05/09/16   General: No apparent distress alert and oriented x3 mood and affect normal, dressed appropriately.  HEENT: Pupils equal, extraocular movements intact  Respiratory: Patient's speak in full sentences and does not appear short of breath  Cardiovascular: No lower extremity edema, non tender, no erythema  Skin: Warm dry intact with no signs of infection or rash on extremities or on axial skeleton.  Abdomen: Soft nontender  Neuro: Cranial nerves II through XII are intact, neurovascularly intact in all extremities with 2+ DTRs and 2+ pulses.  Lymph: No lymphadenopathy of posterior or anterior cervical chain or axillae bilaterally.  Gait normal with good balance and coordination.  MSK:  Non tender with full range of motion and good stability and symmetric strength and tone of shoulders, elbows, wrist, hip,  knee and ankles bilaterally.     Impression and Recommendations:     This Albarracin required medical decision making of moderate complexity.      Note: This dictation was prepared with Dragon dictation along with smaller phrase technology. Any transcriptional errors that result from this process are unintentional.

## 2016-05-11 ENCOUNTER — Ambulatory Visit: Payer: Self-pay | Admitting: Family Medicine

## 2016-05-19 ENCOUNTER — Other Ambulatory Visit: Payer: Self-pay | Admitting: Internal Medicine

## 2016-06-08 ENCOUNTER — Other Ambulatory Visit: Payer: Self-pay | Admitting: Internal Medicine

## 2016-07-01 DIAGNOSIS — L578 Other skin changes due to chronic exposure to nonionizing radiation: Secondary | ICD-10-CM | POA: Diagnosis not present

## 2016-07-01 DIAGNOSIS — I781 Nevus, non-neoplastic: Secondary | ICD-10-CM | POA: Diagnosis not present

## 2016-08-18 ENCOUNTER — Other Ambulatory Visit: Payer: Self-pay | Admitting: Internal Medicine

## 2016-08-18 NOTE — Telephone Encounter (Signed)
LVM for pt to call and schedule a CPE. Informed that med was sent in for 30 days.

## 2016-09-06 ENCOUNTER — Other Ambulatory Visit: Payer: Self-pay | Admitting: Internal Medicine

## 2016-10-04 ENCOUNTER — Other Ambulatory Visit: Payer: Self-pay | Admitting: Internal Medicine

## 2016-10-09 ENCOUNTER — Other Ambulatory Visit: Payer: Self-pay | Admitting: Internal Medicine

## 2016-10-17 ENCOUNTER — Other Ambulatory Visit: Payer: Self-pay | Admitting: Internal Medicine

## 2016-11-05 ENCOUNTER — Ambulatory Visit (INDEPENDENT_AMBULATORY_CARE_PROVIDER_SITE_OTHER): Payer: BLUE CROSS/BLUE SHIELD | Admitting: Internal Medicine

## 2016-11-05 ENCOUNTER — Encounter: Payer: Self-pay | Admitting: Internal Medicine

## 2016-11-05 VITALS — BP 116/78 | HR 63 | Ht 64.0 in | Wt 139.0 lb

## 2016-11-05 DIAGNOSIS — J309 Allergic rhinitis, unspecified: Secondary | ICD-10-CM | POA: Diagnosis not present

## 2016-11-05 DIAGNOSIS — M79601 Pain in right arm: Secondary | ICD-10-CM

## 2016-11-05 DIAGNOSIS — M79644 Pain in right finger(s): Secondary | ICD-10-CM

## 2016-11-05 DIAGNOSIS — M25511 Pain in right shoulder: Secondary | ICD-10-CM | POA: Diagnosis not present

## 2016-11-05 MED ORDER — HYDROCODONE-ACETAMINOPHEN 10-325 MG PO TABS: 1.0000 | ORAL_TABLET | Freq: Four times a day (QID) | ORAL | 0 refills | Status: DC | PRN

## 2016-11-05 MED ORDER — TRIAMCINOLONE ACETONIDE 55 MCG/ACT NA AERO: 2.0000 | INHALATION_SPRAY | Freq: Every day | NASAL | 12 refills | Status: DC

## 2016-11-05 NOTE — Assessment & Plan Note (Signed)
C/w likely bursitis, for sport med referral, pain control,  to f/u any worsening symptoms or concerns

## 2016-11-05 NOTE — Assessment & Plan Note (Signed)
C/w cystic mass, for hand surgury referral 

## 2016-11-05 NOTE — Assessment & Plan Note (Signed)
Moderate persistent despite zyrtec, to add nasacort asd,  to f/u any worsening symptoms or concerns

## 2016-11-05 NOTE — Progress Notes (Addendum)
**Note Angela-Identified via Obfuscation** Subjective:    Patient ID: Angela Webb, female    DOB: 11/29/1970, 46 y.o.   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  Does have a standing desk, which she will do every hour or so, but does not seem to make a difference.  Also with pain and swelling only for 5 days to right epicondylar area.  Also with a tender bump to right thumb DIP that wasn't there a few wks ago.  Also, Does have several wks ongoing nasal allergy symptoms with clearish congestion, itch and sneezing, without fever, pain, ST, cough, swelling or wheezing, all despite good compliance with zyrtec.   Past Medical History:  Diagnosis Date  . Allergic rhinitis   . ALLERGIC RHINITIS 10/03/2007  . Hyperlipidemia   . HYPERLIPIDEMIA 08/08/2008  . Medical history non-contributory   . Neuromuscular disorder (HCC)    DDD   Past Surgical History:  Procedure Laterality Date  . I&D EXTREMITY Left 08/22/2015   Procedure: IRRIGATION AND DEBRIDEMENT EXTREMITY;  Surgeon: Betha LoaKevin Kuzma, MD;  Location: [facility];  Service: Orthopedics;  Laterality: Left;  I & d left thumb  . INCISION AND DRAINAGE Left 08/29/2015   Procedure: REPEAT INCISION AND DRAINAGE LEFT THUMB;  Surgeon: Betha LoaKevin Kuzma, MD;  Location: [facility];  Service: Orthopedics;  Laterality: Left;  . NO PAST SURGERIES      reports that she has never smoked. She has never used smokeless tobacco. She reports that she drinks about 0.6 oz of alcohol per week . She reports that she does not use drugs. family history includes Arthritis in her mother; Diabetes in her brother. Allergies  Allergen Reactions  . Levofloxacin Anaphylaxis  . Levofloxacin     REACTION: LEG PAIN/CRAMPS    . Tramadol Nausea Only   Current Outpatient Prescriptions on File Prior to Visit  Medication Sig Dispense Refill  . ALL DAY ALLERGY 10 MG tablet TAKE ONE TABLET BY MOUTH DAILY 90 tablet 3  . celecoxib (CELEBREX) 200 MG capsule TAKE ONE CAPSULE BY MOUTH TWICE A DAY AS NEEDED *NEEDS OFFICE VISIT 60 capsule 0  . cetirizine (ZYRTEC) 10 MG tablet Take 1 tablet (10 mg total) by mouth daily. 90 tablet 3  . cyclobenzaprine (FLEXERIL) 5 MG tablet TAKE 1 TABLET (5 MG TOTAL) BY MOUTH DAILY. 30 tablet 4  . gabapentin (NEURONTIN) 100 MG capsule TAKE 2 CAPSULES (200 MG TOTAL) BY MOUTH AT BEDTIME. APPOINTMENT NEEDED FOR FUTURE REFILLS 60 capsule 0  . Ibuprofen (ADVIL PO) Take by mouth.    . pantoprazole (PROTONIX) 40 MG tablet Take 1 tablet (40 mg total) by mouth daily. 90 tablet 3  . simvastatin (ZOCOR) 10 MG tablet Take 10 mg by mouth daily at 6 PM.    . simvastatin (ZOCOR) 40 MG tablet TAKE ONE TABLET BY MOUTH DAILY AT 6 PM 30 tablet 0  . sulfamethoxazole-trimethoprim (BACTRIM DS) 800-160 MG tablet Take 1 tablet by mouth 2 (two) times daily. 14 tablet 0  . tiZANidine (ZANAFLEX) 4 MG capsule Take 4 mg by mouth 3 (three) times daily as needed for muscle spasms.    Marland Kitchen. escitalopram (LEXAPRO) 10 MG tablet Take 1 tablet (10 mg  total) by mouth daily. 90 tablet 3  . esomeprazole (NEXIUM) 40 MG capsule Take 1 capsule (40 mg total) by mouth daily. 90 capsule 3   No current facility-administered medications on file prior to visit.     Review of Systems  Constitutional: Negative for other unusual diaphoresis or sweats HENT: Negative for ear discharge or swelling Eyes: Negative for other worsening visual disturbances Respiratory: Negative for stridor or other swelling  Gastrointestinal: Negative for worsening distension or other blood Genitourinary: Negative for retention or other urinary change Musculoskeletal: Negative for other MSK pain or swelling Skin: Negative for color change or other new  lesions Neurological: Negative for worsening tremors and other numbness  Psychiatric/Behavioral: Negative for worsening agitation or other fatigue All other system neg per pt    Objective:   Physical Exam BP 116/78   Pulse 63   Ht 5\' 4"  (1.626 m)   Wt 139 lb (63 kg)   SpO2 99%   BMI 23.86 kg/m  VS noted, not ill appaering Constitutional: Pt appears in NAD HENT: Head: NCAT.  Right Ear: External ear normal.  Left Ear: External ear normal.  Eyes: . Pupils are equal, round, and reactive to light. Conjunctivae and EOM are normal Nose: without d/c or deformity Bilat tm's with mild erythema.  Max sinus areas non tender.  Pharynx with mild erythema, no exudate Neck: Neck supple. Gross normal ROM, Spine: nontender in midline Cardiovascular: Normal rate and regular rhythm.   Pulmonary/Chest: Effort normal and breath sounds without rales or wheezing.  Right shoulder with marked subacromial tender with reduced ROM to 100 degrees only Right lateral epicondylar area with mild tender, swelling Right thumb with DIP spongy mass dorsomedial aspect, approx 8 mm, mild tender Neurological: Pt is alert. At baseline orientation, motor grossly intact Skin: Skin is warm. No rashes, other new lesions, no LE edema Psychiatric: Pt behavior is normal without agitation  No other exam findings    Assessment & Plan:

## 2016-11-05 NOTE — Patient Instructions (Signed)
Please take all new medication as prescribed - the pain medication, and the nasacort for allergies  Please continue all other medications as before, and refills have been done if requested.  Please have the pharmacy call with any other refills you may need.  You will be contacted regarding the referral for: Dr Katrinka BlazingSmith asap, and Dr Merlyn LotKuzma for the thumb  Please keep your appointments with your specialists as you may have planned

## 2016-11-05 NOTE — Assessment & Plan Note (Signed)
Exam c/w mild lateral epicondylitis, for cont'd nsaid prn, and rest as able

## 2016-11-10 ENCOUNTER — Telehealth: Payer: Self-pay

## 2016-11-10 MED ORDER — DICLOFENAC SODIUM 1 % TD GEL: 4.0000 g | Freq: Four times a day (QID) | TRANSDERMAL | 5 refills | Status: DC | PRN

## 2016-11-10 NOTE — Telephone Encounter (Signed)
rx done erx 

## 2016-11-10 NOTE — Addendum Note (Signed)
Addended by: Corwin LevinsJOHN, Tonia Avino W on: 11/10/2016 12:29 PM   Modules accepted: Orders

## 2016-11-10 NOTE — Telephone Encounter (Signed)
[facility] requested diclofenac sodium 1% gel refill but I didn't see the medication listed in the pt's chart. Please advise.

## 2016-11-18 ENCOUNTER — Other Ambulatory Visit (INDEPENDENT_AMBULATORY_CARE_PROVIDER_SITE_OTHER): Payer: BLUE CROSS/BLUE SHIELD

## 2016-11-18 ENCOUNTER — Encounter: Payer: Self-pay | Admitting: Internal Medicine

## 2016-11-18 ENCOUNTER — Telehealth: Payer: Self-pay | Admitting: Internal Medicine

## 2016-11-18 ENCOUNTER — Ambulatory Visit (INDEPENDENT_AMBULATORY_CARE_PROVIDER_SITE_OTHER): Payer: BLUE CROSS/BLUE SHIELD | Admitting: Internal Medicine

## 2016-11-18 VITALS — BP 116/78 | HR 63 | Ht 64.0 in | Wt 140.0 lb

## 2016-11-18 DIAGNOSIS — Z114 Encounter for screening for human immunodeficiency virus [HIV]: Secondary | ICD-10-CM

## 2016-11-18 DIAGNOSIS — Z0000 Encounter for general adult medical examination without abnormal findings: Secondary | ICD-10-CM

## 2016-11-18 DIAGNOSIS — E785 Hyperlipidemia, unspecified: Secondary | ICD-10-CM | POA: Diagnosis not present

## 2016-11-18 DIAGNOSIS — E28319 Asymptomatic premature menopause: Secondary | ICD-10-CM | POA: Insufficient documentation

## 2016-11-18 LAB — BASIC METABOLIC PANEL
BUN: 14 mg/dL (ref 6–23)
CHLORIDE: 103 meq/L (ref 96–112)
CO2: 30 meq/L (ref 19–32)
CREATININE: 0.88 mg/dL (ref 0.40–1.20)
Calcium: 10.5 mg/dL (ref 8.4–10.5)
GFR: 73.01 mL/min (ref >60.00)
GLUCOSE: 91 mg/dL (ref 70–99)
Potassium: 4.1 mEq/L (ref 3.5–5.1)
Sodium: 139 mEq/L (ref 135–145)

## 2016-11-18 LAB — URINALYSIS, ROUTINE W REFLEX MICROSCOPIC
BILIRUBIN URINE: NEGATIVE
Hgb urine dipstick: NEGATIVE
Ketones, ur: NEGATIVE
LEUKOCYTES UA: NEGATIVE
Nitrite: NEGATIVE
PH: 6.5 (ref 5.0–8.0)
RBC / HPF: NONE SEEN (ref 0–?)
TOTAL PROTEIN, URINE-UPE24: NEGATIVE
Urine Glucose: NEGATIVE
Urobilinogen, UA: 0.2 (ref 0.0–1.0)
WBC, UA: NONE SEEN (ref 0–?)

## 2016-11-18 LAB — CBC WITH DIFFERENTIAL/PLATELET
Basophils Absolute: 0 10*3/uL (ref 0.0–0.1)
Basophils Relative: 0.7 % (ref 0.0–3.0)
EOS PCT: 2.5 % (ref 0.0–5.0)
Eosinophils Absolute: 0.2 10*3/uL (ref 0.0–0.7)
HCT: 38.5 % (ref 36.0–46.0)
Hemoglobin: 13.1 g/dL (ref 12.0–15.0)
LYMPHS ABS: 2.3 10*3/uL (ref 0.7–4.0)
Lymphocytes Relative: 30.8 % (ref 12.0–46.0)
MCHC: 33.9 g/dL (ref 30.0–36.0)
MCV: 93.4 fl (ref 78.0–100.0)
MONO ABS: 0.6 10*3/uL (ref 0.1–1.0)
MONOS PCT: 8 % (ref 3.0–12.0)
NEUTROS ABS: 4.3 10*3/uL (ref 1.4–7.7)
NEUTROS PCT: 58 % (ref 43.0–77.0)
PLATELETS: 280 10*3/uL (ref 150.0–400.0)
RBC: 4.13 Mil/uL (ref 3.87–5.11)
RDW: 12.8 % (ref 11.5–15.5)
WBC: 7.4 10*3/uL (ref 4.0–10.5)

## 2016-11-18 LAB — TSH: TSH: 1.86 u[IU]/mL (ref 0.35–4.50)

## 2016-11-18 LAB — HEPATIC FUNCTION PANEL
ALK PHOS: 63 U/L (ref 39–117)
ALT: 59 U/L — AB (ref 0–35)
AST: 58 U/L — ABNORMAL HIGH (ref 0–37)
Albumin: 4.5 g/dL (ref 3.5–5.2)
BILIRUBIN DIRECT: 0.1 mg/dL (ref 0.0–0.3)
TOTAL PROTEIN: 7.1 g/dL (ref 6.0–8.3)
Total Bilirubin: 0.4 mg/dL (ref 0.2–1.2)

## 2016-11-18 LAB — LIPID PANEL
CHOLESTEROL: 201 mg/dL — AB (ref 0–200)
HDL: 82.9 mg/dL (ref >39.00)
LDL Cholesterol: 95 mg/dL (ref 0–99)
NONHDL: 118.17
Total CHOL/HDL Ratio: 2
Triglycerides: 117 mg/dL (ref 0.0–149.0)
VLDL: 23.4 mg/dL (ref 0.0–40.0)

## 2016-11-18 MED ORDER — CELECOXIB 200 MG PO CAPS: ORAL_CAPSULE | ORAL | 3 refills | Status: DC

## 2016-11-18 MED ORDER — CYCLOBENZAPRINE HCL 5 MG PO TABS: 5.0000 mg | ORAL_TABLET | Freq: Every day | ORAL | 4 refills | Status: DC

## 2016-11-18 MED ORDER — CETIRIZINE HCL 10 MG PO TABS: 10.0000 mg | ORAL_TABLET | Freq: Every day | ORAL | 3 refills | Status: DC

## 2016-11-18 MED ORDER — GABAPENTIN 100 MG PO CAPS: ORAL_CAPSULE | ORAL | 1 refills | Status: DC

## 2016-11-18 NOTE — Progress Notes (Addendum)
**Note Angela-Identified via Obfuscation** Subjective:    Patient ID: Angela Webb, female    DOB: 24-Jun-1970, 46 y.o.   MRN: [id number]  HPI  Here for wellness and f/u;  Overall doing ok;  Pt denies Chest pain, worsening SOB, DOE, wheezing, orthopnea, PND, worsening LE edema, palpitations, dizziness or syncope.  Pt denies neurological change such as new headache, facial or extremity weakness.  Pt denies polydipsia, polyuria, or low sugar symptoms. Pt states overall good compliance with treatment and medications, good tolerability, and has been trying to follow appropriate diet.  Pt denies worsening depressive symptoms, suicidal ideation or panic. No fever, night sweats, wt loss, loss of appetite, or other constitutional symptoms.  Pt states good ability with ADL's, has low fall risk, home safety reviewed and adequate, no other significant changes in hearing or vision, and only occasionally active with exercise, due to recurrent back pain and right shoulder area pain, can only stand for 45 min before onset pain. Mentions no longer with menses x 2 yrs, now early menopausal per pt GYN per pt Past Medical History:  Diagnosis Date  . Allergic rhinitis   . ALLERGIC RHINITIS 10/03/2007  . Hyperlipidemia   . HYPERLIPIDEMIA 08/08/2008  . Medical history non-contributory   . Neuromuscular disorder (HCC)    DDD   Past Surgical History:  Procedure Laterality Date  . I&D EXTREMITY Left 08/22/2015   Procedure: IRRIGATION AND DEBRIDEMENT EXTREMITY;  Surgeon: Betha Loa, MD;  Location: [facility];  Service: Orthopedics;  Laterality: Left;  I & d left thumb  . INCISION AND DRAINAGE Left 08/29/2015   Procedure: REPEAT INCISION AND DRAINAGE LEFT THUMB;  Surgeon: Betha Loa, MD;  Location: [facility];  Service: Orthopedics;  Laterality: Left;  . NO PAST SURGERIES      reports that she has never smoked. She has never used smokeless tobacco. She reports that she drinks about 0.6 oz of alcohol per week . She reports that she  does not use drugs. family history includes Arthritis in her mother; Diabetes in her brother. Allergies  Allergen Reactions  . Levofloxacin Anaphylaxis  . Levofloxacin     REACTION: LEG PAIN/CRAMPS  . Tramadol Nausea Only   Current Outpatient Prescriptions on File Prior to Visit  Medication Sig Dispense Refill  . diclofenac sodium (VOLTAREN) 1 % GEL Apply 4 g topically 4 (four) times daily as needed. 400 g 5  . HYDROcodone-acetaminophen (NORCO) 10-325 MG tablet Take 1 tablet by mouth every 6 (six) hours as needed. 30 tablet 0  . Ibuprofen (ADVIL PO) Take by mouth.    . pantoprazole (PROTONIX) 40 MG tablet Take 1 tablet (40 mg total) by mouth daily. 90 tablet 3  . simvastatin (ZOCOR) 40 MG tablet TAKE ONE TABLET BY MOUTH DAILY AT 6 PM 30 tablet 0  . triamcinolone (NASACORT) 55 MCG/ACT AERO nasal inhaler Place 2 sprays into the nose daily. 1 Inhaler 12   No current facility-administered medications on file prior to visit.    Review of Systems Constitutional: Negative for other unusual diaphoresis, sweats, appetite or weight changes HENT: Negative for other worsening hearing loss, ear pain, facial swelling, mouth sores or neck stiffness.   Eyes: Negative for other worsening pain, redness or other visual disturbance.  Respiratory: Negative for other stridor or swelling Cardiovascular: Negative for other palpitations or other chest pain  Gastrointestinal: Negative for worsening diarrhea or loose stools, blood in stool, distention or other pain Genitourinary: Negative for hematuria, flank pain or other change in  urine volume.  Musculoskeletal: Negative for myalgias or other joint swelling.  Skin: Negative for other color change, or other wound or worsening drainage.  Neurological: Negative for other syncope or numbness. Hematological: Negative for other adenopathy or swelling Psychiatric/Behavioral: Negative for hallucinations, other worsening agitation, SI, self-injury, or new decreased  concentration All other system neg per pt    Objective:   Physical Exam BP 116/78   Pulse 63   Ht 5\' 4"  (1.626 m)   Wt 140 lb (63.5 kg)   SpO2 98%   BMI 24.03 kg/m   VS noted,  Constitutional: Pt is oriented to person, place, and time. Appears well-developed and well-nourished, in no significant distress and comfortable Head: Normocephalic and atraumatic  Eyes: Conjunctivae and EOM are normal. Pupils are equal, round, and reactive to light Right Ear: External ear normal without discharge Left Ear: External ear normal without discharge Nose: Nose without discharge or deformity Mouth/Throat: Oropharynx is without other ulcerations and moist  Neck: Normal range of motion. Neck supple. No JVD present. No tracheal deviation present or significant neck LA or mass Cardiovascular: Normal rate, regular rhythm, normal heart sounds and intact distal pulses.   Pulmonary/Chest: WOB normal and breath sounds without rales or wheezing  Abdominal: Soft. Bowel sounds are normal. NT. No HSM  Musculoskeletal: Normal range of motion. Exhibits no edema Lymphadenopathy: Has no other cervical adenopathy.  Neurological: Pt is alert and oriented to person, place, and time. Pt has normal reflexes. No cranial nerve deficit. Motor grossly intact, Gait intact Skin: Skin is warm and dry. No rash noted or new ulcerations Psychiatric:  Has normal mood and affect. Behavior is normal without agitation No other exam findings  Lab Results  Component Value Date   WBC 8.5 01/26/2014   HGB 12.1 01/26/2014   HCT 35.2 (L) 01/26/2014   PLT 279.0 01/26/2014   GLUCOSE 111 (H) 01/26/2014   CHOL 188 01/26/2014   TRIG 143.0 01/26/2014   HDL 78.30 01/26/2014   LDLDIRECT 108.8 08/22/2012   LDLCALC 81 01/26/2014   ALT 12 01/26/2014   AST 14 01/26/2014   NA 134 (L) 01/26/2014   K 3.8 01/26/2014   CL 101 01/26/2014   CREATININE 1.0 01/26/2014   BUN 16 01/26/2014   CO2 28 01/26/2014   TSH 2.81 01/26/2014        Assessment & Plan:

## 2016-11-18 NOTE — Assessment & Plan Note (Signed)

## 2016-11-18 NOTE — Assessment & Plan Note (Signed)
Goal ldl < 100, cont statin, for fu lab

## 2016-11-18 NOTE — Patient Instructions (Signed)

## 2016-11-18 NOTE — Telephone Encounter (Signed)
error 

## 2016-11-19 LAB — HIV ANTIBODY (ROUTINE TESTING W REFLEX): HIV: NONREACTIVE

## 2016-11-22 ENCOUNTER — Other Ambulatory Visit: Payer: Self-pay | Admitting: Internal Medicine

## 2016-12-03 ENCOUNTER — Other Ambulatory Visit: Payer: Self-pay | Admitting: Internal Medicine

## 2016-12-13 ENCOUNTER — Other Ambulatory Visit: Payer: Self-pay | Admitting: Internal Medicine

## 2017-01-12 ENCOUNTER — Other Ambulatory Visit: Payer: Self-pay | Admitting: Orthopedic Surgery

## 2017-01-12 DIAGNOSIS — M19041 Primary osteoarthritis, right hand: Secondary | ICD-10-CM | POA: Diagnosis not present

## 2017-01-12 DIAGNOSIS — M79644 Pain in right finger(s): Secondary | ICD-10-CM | POA: Diagnosis not present

## 2017-01-12 DIAGNOSIS — M6740 Ganglion, unspecified site: Secondary | ICD-10-CM | POA: Diagnosis not present

## 2017-01-13 ENCOUNTER — Encounter (HOSPITAL_BASED_OUTPATIENT_CLINIC_OR_DEPARTMENT_OTHER): Payer: Self-pay | Admitting: *Deleted

## 2017-01-19 ENCOUNTER — Encounter (HOSPITAL_BASED_OUTPATIENT_CLINIC_OR_DEPARTMENT_OTHER): Payer: Self-pay | Admitting: *Deleted

## 2017-01-21 ENCOUNTER — Encounter (HOSPITAL_BASED_OUTPATIENT_CLINIC_OR_DEPARTMENT_OTHER): Payer: Self-pay | Admitting: Certified Registered"

## 2017-01-21 ENCOUNTER — Ambulatory Visit (HOSPITAL_BASED_OUTPATIENT_CLINIC_OR_DEPARTMENT_OTHER): Payer: BLUE CROSS/BLUE SHIELD | Admitting: Certified Registered"

## 2017-01-21 ENCOUNTER — Encounter (HOSPITAL_BASED_OUTPATIENT_CLINIC_OR_DEPARTMENT_OTHER)
Admission: RE | Disposition: A | Discharge status: Home / Self Care | Payer: Self-pay | Source: Ambulatory Visit | Attending: Orthopedic Surgery

## 2017-01-21 ENCOUNTER — Ambulatory Visit (HOSPITAL_BASED_OUTPATIENT_CLINIC_OR_DEPARTMENT_OTHER)
Admission: RE | Admit: 2017-01-21 | Discharge: 2017-01-21 | Disposition: A | Discharge status: Home / Self Care | Payer: BLUE CROSS/BLUE SHIELD | Source: Ambulatory Visit | Attending: Orthopedic Surgery | Admitting: Orthopedic Surgery

## 2017-01-21 DIAGNOSIS — E785 Hyperlipidemia, unspecified: Secondary | ICD-10-CM | POA: Diagnosis not present

## 2017-01-21 DIAGNOSIS — M7138 Other bursal cyst, other site: Secondary | ICD-10-CM | POA: Insufficient documentation

## 2017-01-21 DIAGNOSIS — K219 Gastro-esophageal reflux disease without esophagitis: Secondary | ICD-10-CM | POA: Insufficient documentation

## 2017-01-21 DIAGNOSIS — M13841 Other specified arthritis, right hand: Secondary | ICD-10-CM | POA: Insufficient documentation

## 2017-01-21 DIAGNOSIS — Z888 Allergy status to other drugs, medicaments and biological substances status: Secondary | ICD-10-CM | POA: Diagnosis not present

## 2017-01-21 DIAGNOSIS — Z79891 Long term (current) use of opiate analgesic: Secondary | ICD-10-CM | POA: Insufficient documentation

## 2017-01-21 DIAGNOSIS — M67441 Ganglion, right hand: Secondary | ICD-10-CM | POA: Diagnosis not present

## 2017-01-21 DIAGNOSIS — M1811 Unilateral primary osteoarthritis of first carpometacarpal joint, right hand: Secondary | ICD-10-CM | POA: Diagnosis not present

## 2017-01-21 DIAGNOSIS — L728 Other follicular cysts of the skin and subcutaneous tissue: Secondary | ICD-10-CM | POA: Diagnosis not present

## 2017-01-21 DIAGNOSIS — Z791 Long term (current) use of non-steroidal anti-inflammatories (NSAID): Secondary | ICD-10-CM | POA: Diagnosis not present

## 2017-01-21 DIAGNOSIS — Z79899 Other long term (current) drug therapy: Secondary | ICD-10-CM | POA: Insufficient documentation

## 2017-01-21 DIAGNOSIS — M71341 Other bursal cyst, right hand: Secondary | ICD-10-CM | POA: Diagnosis not present

## 2017-01-21 HISTORY — DX: Nausea with vomiting, unspecified: R11.2

## 2017-01-21 HISTORY — DX: Other specified postprocedural states: Z98.890

## 2017-01-21 HISTORY — PX: EXCISION MASS UPPER EXTREMETIES: SHX6704

## 2017-01-21 HISTORY — DX: Gastro-esophageal reflux disease without esophagitis: K21.9

## 2017-01-21 HISTORY — DX: Adverse effect of unspecified anesthetic, initial encounter: T41.45XA

## 2017-01-21 HISTORY — DX: Other complications of anesthesia, initial encounter: T88.59XA

## 2017-01-21 SURGERY — EXCISION MASS UPPER EXTREMITIES
Anesthesia: General | Site: Thumb | Laterality: Right

## 2017-01-21 MED ORDER — MEPERIDINE HCL 25 MG/ML IJ SOLN: 6.2500 mg | INTRAMUSCULAR | Status: DC | PRN

## 2017-01-21 MED ORDER — OXYCODONE HCL 5 MG/5ML PO SOLN: 5.0000 mg | Freq: Once | ORAL | Status: DC | PRN

## 2017-01-21 MED ORDER — LACTATED RINGERS IV SOLN
INTRAVENOUS | Status: DC
  Administered 2017-01-21: 08:00:00 via INTRAVENOUS

## 2017-01-21 MED ORDER — PROPOFOL 500 MG/50ML IV EMUL
INTRAVENOUS | Status: AC
  Filled 2017-01-21: qty 50

## 2017-01-21 MED ORDER — LIDOCAINE 2% (20 MG/ML) 5 ML SYRINGE
INTRAMUSCULAR | Status: DC | PRN
  Administered 2017-01-21: 50 mg via INTRAVENOUS

## 2017-01-21 MED ORDER — BUPIVACAINE HCL (PF) 0.25 % IJ SOLN
INTRAMUSCULAR | Status: DC | PRN
  Administered 2017-01-21: 9 mL

## 2017-01-21 MED ORDER — BUPIVACAINE HCL (PF) 0.5 % IJ SOLN
INTRAMUSCULAR | Status: AC
  Filled 2017-01-21: qty 30

## 2017-01-21 MED ORDER — ONDANSETRON HCL 4 MG/2ML IJ SOLN
INTRAMUSCULAR | Status: AC
  Filled 2017-01-21: qty 2

## 2017-01-21 MED ORDER — MIDAZOLAM HCL 2 MG/2ML IJ SOLN
1.0000 mg | INTRAMUSCULAR | Status: DC | PRN
  Administered 2017-01-21: 2 mg via INTRAVENOUS

## 2017-01-21 MED ORDER — MIDAZOLAM HCL 2 MG/2ML IJ SOLN
INTRAMUSCULAR | Status: AC
  Filled 2017-01-21: qty 2

## 2017-01-21 MED ORDER — ONDANSETRON HCL 4 MG PO TABS: 4.0000 mg | ORAL_TABLET | Freq: Three times a day (TID) | ORAL | 0 refills | Status: DC | PRN

## 2017-01-21 MED ORDER — DEXAMETHASONE SODIUM PHOSPHATE 10 MG/ML IJ SOLN
INTRAMUSCULAR | Status: DC | PRN
  Administered 2017-01-21: 10 mg via INTRAVENOUS

## 2017-01-21 MED ORDER — HYDROMORPHONE HCL 1 MG/ML IJ SOLN: 0.2500 mg | INTRAMUSCULAR | Status: DC | PRN

## 2017-01-21 MED ORDER — LIDOCAINE HCL (PF) 1 % IJ SOLN
INTRAMUSCULAR | Status: AC
  Filled 2017-01-21: qty 5

## 2017-01-21 MED ORDER — EPHEDRINE SULFATE-NACL 50-0.9 MG/10ML-% IV SOSY
PREFILLED_SYRINGE | INTRAVENOUS | Status: DC | PRN
  Administered 2017-01-21 (×3): 10 mg via INTRAVENOUS

## 2017-01-21 MED ORDER — FENTANYL CITRATE (PF) 100 MCG/2ML IJ SOLN
50.0000 ug | INTRAMUSCULAR | Status: DC | PRN
  Administered 2017-01-21: 50 ug via INTRAVENOUS

## 2017-01-21 MED ORDER — CHLORHEXIDINE GLUCONATE 4 % EX LIQD: 60.0000 mL | Freq: Once | CUTANEOUS | Status: DC

## 2017-01-21 MED ORDER — CEFAZOLIN SODIUM-DEXTROSE 2-4 GM/100ML-% IV SOLN
INTRAVENOUS | Status: AC
  Filled 2017-01-21: qty 100

## 2017-01-21 MED ORDER — ONDANSETRON HCL 4 MG/2ML IJ SOLN
INTRAMUSCULAR | Status: DC | PRN
  Administered 2017-01-21: 4 mg via INTRAVENOUS

## 2017-01-21 MED ORDER — SODIUM CHLORIDE 0.9 % IJ SOLN
INTRAMUSCULAR | Status: AC
  Filled 2017-01-21: qty 10

## 2017-01-21 MED ORDER — LIDOCAINE 2% (20 MG/ML) 5 ML SYRINGE
INTRAMUSCULAR | Status: AC
  Filled 2017-01-21: qty 5

## 2017-01-21 MED ORDER — DEXAMETHASONE SODIUM PHOSPHATE 10 MG/ML IJ SOLN
INTRAMUSCULAR | Status: AC
  Filled 2017-01-21: qty 1

## 2017-01-21 MED ORDER — OXYCODONE HCL 5 MG PO TABS: 5.0000 mg | ORAL_TABLET | Freq: Once | ORAL | Status: DC | PRN

## 2017-01-21 MED ORDER — PROPOFOL 500 MG/50ML IV EMUL
INTRAVENOUS | Status: DC | PRN
  Administered 2017-01-21: 200 ug/kg/min via INTRAVENOUS

## 2017-01-21 MED ORDER — SCOPOLAMINE 1 MG/3DAYS TD PT72
1.0000 | MEDICATED_PATCH | Freq: Once | TRANSDERMAL | Status: DC
  Administered 2017-01-21: 1.5 mg via TRANSDERMAL

## 2017-01-21 MED ORDER — FENTANYL CITRATE (PF) 100 MCG/2ML IJ SOLN
INTRAMUSCULAR | Status: AC
  Filled 2017-01-21: qty 2

## 2017-01-21 MED ORDER — HYDROCODONE-ACETAMINOPHEN 5-325 MG PO TABS: ORAL_TABLET | ORAL | 0 refills | Status: DC

## 2017-01-21 MED ORDER — PROMETHAZINE HCL 25 MG/ML IJ SOLN: 6.2500 mg | INTRAMUSCULAR | Status: DC | PRN

## 2017-01-21 MED ORDER — PROPOFOL 10 MG/ML IV BOLUS
INTRAVENOUS | Status: DC | PRN
  Administered 2017-01-21: 200 mg via INTRAVENOUS

## 2017-01-21 MED ORDER — CEFAZOLIN SODIUM-DEXTROSE 2-4 GM/100ML-% IV SOLN
2.0000 g | INTRAVENOUS | Status: AC
  Administered 2017-01-21: 2 g via INTRAVENOUS

## 2017-01-21 MED ORDER — SCOPOLAMINE 1 MG/3DAYS TD PT72: 1.0000 | MEDICATED_PATCH | Freq: Once | TRANSDERMAL | Status: DC | PRN

## 2017-01-21 MED ORDER — PROPOFOL 10 MG/ML IV BOLUS
INTRAVENOUS | Status: AC
  Filled 2017-01-21: qty 20

## 2017-01-21 MED ORDER — SCOPOLAMINE 1 MG/3DAYS TD PT72
MEDICATED_PATCH | TRANSDERMAL | Status: AC
  Filled 2017-01-21: qty 1

## 2017-01-21 SURGICAL SUPPLY — 56 items
APL SKNCLS STERI-STRIP NONHPOA (GAUZE/BANDAGES/DRESSINGS)
BANDAGE ACE 3X5.8 VEL STRL LF (GAUZE/BANDAGES/DRESSINGS) IMPLANT
BANDAGE COBAN STERILE 2 (GAUZE/BANDAGES/DRESSINGS) IMPLANT
BENZOIN TINCTURE PRP APPL 2/3 (GAUZE/BANDAGES/DRESSINGS) IMPLANT
BLADE MINI RND TIP GREEN BEAV (BLADE) IMPLANT
BLADE SURG 15 STRL LF DISP TIS (BLADE) ×2 IMPLANT
BLADE SURG 15 STRL SS (BLADE) ×6
BNDG CMPR 9X4 STRL LF SNTH (GAUZE/BANDAGES/DRESSINGS) ×1
BNDG COHESIVE 1X5 TAN STRL LF (GAUZE/BANDAGES/DRESSINGS) ×2 IMPLANT
BNDG CONFORM 2 STRL LF (GAUZE/BANDAGES/DRESSINGS) IMPLANT
BNDG ELASTIC 2X5.8 VLCR STR LF (GAUZE/BANDAGES/DRESSINGS) IMPLANT
BNDG ESMARK 4X9 LF (GAUZE/BANDAGES/DRESSINGS) ×2 IMPLANT
BNDG GAUZE 1X2.1 STRL (MISCELLANEOUS) IMPLANT
BNDG GAUZE ELAST 4 BULKY (GAUZE/BANDAGES/DRESSINGS) IMPLANT
BNDG PLASTER X FAST 3X3 WHT LF (CAST SUPPLIES) IMPLANT
BNDG PLSTR 9X3 FST ST WHT (CAST SUPPLIES)
CHLORAPREP W/TINT 26ML (MISCELLANEOUS) ×3 IMPLANT
CLOSURE WOUND 1/2 X4 (GAUZE/BANDAGES/DRESSINGS)
CORD BIPOLAR FORCEPS 12FT (ELECTRODE) ×3 IMPLANT
COVER BACK TABLE 60X90IN (DRAPES) ×3 IMPLANT
COVER MAYO STAND STRL (DRAPES) ×3 IMPLANT
CUFF TOURNIQUET SINGLE 18IN (TOURNIQUET CUFF) ×3 IMPLANT
DRAPE EXTREMITY T 121X128X90 (DRAPE) ×3 IMPLANT
DRAPE SURG 17X23 STRL (DRAPES) ×3 IMPLANT
GAUZE SPONGE 4X4 12PLY STRL (GAUZE/BANDAGES/DRESSINGS) ×3 IMPLANT
GAUZE XEROFORM 1X8 LF (GAUZE/BANDAGES/DRESSINGS) ×3 IMPLANT
GLOVE BIO SURGEON STRL SZ 6.5 (GLOVE) ×1 IMPLANT
GLOVE BIO SURGEON STRL SZ7.5 (GLOVE) ×3 IMPLANT
GLOVE BIO SURGEONS STRL SZ 6.5 (GLOVE) ×1
GLOVE BIOGEL PI IND STRL 7.0 (GLOVE) IMPLANT
GLOVE BIOGEL PI IND STRL 8 (GLOVE) ×1 IMPLANT
GLOVE BIOGEL PI INDICATOR 7.0 (GLOVE) ×2
GLOVE BIOGEL PI INDICATOR 8 (GLOVE) ×2
GOWN STRL REUS W/ TWL LRG LVL3 (GOWN DISPOSABLE) ×1 IMPLANT
GOWN STRL REUS W/ TWL XL LVL3 (GOWN DISPOSABLE) IMPLANT
GOWN STRL REUS W/TWL LRG LVL3 (GOWN DISPOSABLE)
GOWN STRL REUS W/TWL XL LVL3 (GOWN DISPOSABLE) ×6 IMPLANT
NDL HYPO 25X1 1.5 SAFETY (NEEDLE) ×1 IMPLANT
NEEDLE HYPO 25X1 1.5 SAFETY (NEEDLE) ×3 IMPLANT
NS IRRIG 1000ML POUR BTL (IV SOLUTION) ×3 IMPLANT
PACK BASIN DAY SURGERY FS (CUSTOM PROCEDURE TRAY) ×3 IMPLANT
PAD CAST 3X4 CTTN HI CHSV (CAST SUPPLIES) IMPLANT
PAD CAST 4YDX4 CTTN HI CHSV (CAST SUPPLIES) IMPLANT
PADDING CAST ABS 4INX4YD NS (CAST SUPPLIES)
PADDING CAST ABS COTTON 4X4 ST (CAST SUPPLIES) ×1 IMPLANT
PADDING CAST COTTON 3X4 STRL (CAST SUPPLIES)
PADDING CAST COTTON 4X4 STRL (CAST SUPPLIES)
SPLINT FINGER 3.25 911903 (SOFTGOODS) ×2 IMPLANT
STOCKINETTE 4X48 STRL (DRAPES) ×3 IMPLANT
STRIP CLOSURE SKIN 1/2X4 (GAUZE/BANDAGES/DRESSINGS) IMPLANT
SUT ETHILON 3 0 PS 1 (SUTURE) IMPLANT
SUT ETHILON 4 0 PS 2 18 (SUTURE) ×3 IMPLANT
SYR BULB 3OZ (MISCELLANEOUS) ×3 IMPLANT
SYR CONTROL 10ML LL (SYRINGE) ×3 IMPLANT
TOWEL OR 17X24 6PK STRL BLUE (TOWEL DISPOSABLE) ×6 IMPLANT
UNDERPAD 30X30 (UNDERPADS AND DIAPERS) ×1 IMPLANT

## 2017-01-21 NOTE — Brief Op Note (Signed)
01/21/2017  9:27 AM  PATIENT:  Angela Webb  46 y.o. female  PRE-OPERATIVE DIAGNOSIS:  right thumb cyst and distal interphalangeal joint arthritis  POST-OPERATIVE DIAGNOSIS:  ight thumb cyst and distal interphalangeal joint arthritis  PROCEDURE:  Procedure(s): RIGHT THUMB EXCISION CYST AND DEBRIEDMENT DISTAL INTERPHALANGEAL JOINT (Right)  SURGEON:  Surgeon(s) and Role:    * Betha Loa, MD - Primary  PHYSICIAN ASSISTANT:   ASSISTANTS: none   ANESTHESIA:   general  EBL:  Total I/O In: 200 [I.V.:200] Out: 5 [Blood:5]  BLOOD ADMINISTERED:none  DRAINS: none   LOCAL MEDICATIONS USED:  MARCAINE     SPECIMEN:  Source of Specimen:  right thumb  DISPOSITION OF SPECIMEN:  PATHOLOGY  COUNTS:  YES  TOURNIQUET:   Total Tourniquet Time Documented: Upper Arm (Right) - 15 minutes Total: Upper Arm (Right) - 15 minutes   DICTATION: .Other Dictation: Dictation Number [id number]  PLAN OF CARE: Discharge to home after PACU  PATIENT DISPOSITION:  PACU - hemodynamically stable.

## 2017-01-21 NOTE — Transfer of Care (Signed)
Immediate Anesthesia Transfer of Care Note  Patient: Angela Webb  Procedure(s) Performed: Procedure(s) (LRB): RIGHT THUMB EXCISION CYST AND DEBRIEDMENT DISTAL INTERPHALANGEAL JOINT (Right)  Patient Location: PACU  Anesthesia Type: General  Level of Consciousness: awake, oriented, sedated and patient cooperative  Airway & Oxygen Therapy: Patient Spontanous Breathing and Patient connected to face mask oxygen  Post-op Assessment: Report given to PACU RN and Post -op Vital signs reviewed and stable  Post vital signs: Reviewed and stable  Complications: No apparent anesthesia complications  Last Vitals:  Vitals:   01/21/17 0729 01/21/17 0930  BP: 110/86 (!) 93/57  Pulse: 63 87  Resp: 16 18  Temp: 36.7 C (!) 36.4 C  SpO2: 100% 100%    Last Pain:  Vitals:   01/21/17 0729  TempSrc: Oral

## 2017-01-21 NOTE — H&P (Signed)
Angela Webb is an 46 y.o. female.   Chief Complaint: right thumb mucoid cyst HPI: 46 yo female with mass right thumb.  This is bothersome to her and she wishes to have it removed.  Allergies:  Allergies  Allergen Reactions  . Levofloxacin Anaphylaxis  . Levofloxacin     REACTION: LEG PAIN/CRAMPS  . Tramadol Nausea Only    Past Medical History:  Diagnosis Date  . Allergic rhinitis   . ALLERGIC RHINITIS 10/03/2007  . Complication of anesthesia   . GERD (gastroesophageal reflux disease)   . Hyperlipidemia   . HYPERLIPIDEMIA 08/08/2008  . Medical history non-contributory   . Neuromuscular disorder (HCC)    DDD  . PONV (postoperative nausea and vomiting)     Past Surgical History:  Procedure Laterality Date  . I&D EXTREMITY Left 08/22/2015   Procedure: IRRIGATION AND DEBRIDEMENT EXTREMITY;  Surgeon: Betha Loa, MD;  Location: [facility];  Service: Orthopedics;  Laterality: Left;  I & d left thumb  . INCISION AND DRAINAGE Left 08/29/2015   Procedure: REPEAT INCISION AND DRAINAGE LEFT THUMB;  Surgeon: Betha Loa, MD;  Location: [facility];  Service: Orthopedics;  Laterality: Left;    Family History: Family History  Problem Relation Age of Onset  . Arthritis Mother   . Diabetes Brother        type I, deceased    Social History:   reports that she has never smoked. She has never used smokeless tobacco. She reports that she drinks about 0.6 oz of alcohol per week . She reports that she does not use drugs.  Medications: Medications Prior to Admission  Medication Sig Dispense Refill  . celecoxib (CELEBREX) 200 MG capsule TAKE ONE CAPSULE BY MOUTH TWICE A DAY AS NEEDED 180 capsule 3  . cetirizine (ZYRTEC) 10 MG tablet Take 1 tablet (10 mg total) by mouth daily. 90 tablet 3  . cyclobenzaprine (FLEXERIL) 5 MG tablet TAKE 1 TABLET (5 MG TOTAL) BY MOUTH DAILY. 30 tablet 4  . diclofenac sodium (VOLTAREN) 1 % GEL Apply 4 g topically 4 (four) times daily  as needed. 400 g 5  . gabapentin (NEURONTIN) 100 MG capsule TAKE 2 CAPSULES (200 MG TOTAL) BY MOUTH AT BEDTIMe 180 capsule 1  . HYDROcodone-acetaminophen (NORCO) 10-325 MG tablet Take 1 tablet by mouth every 6 (six) hours as needed. 30 tablet 0  . Ibuprofen (ADVIL PO) Take by mouth.    . pantoprazole (PROTONIX) 40 MG tablet TAKE 1 TABLET (40 MG TOTAL) BY MOUTH DAILY. 90 tablet 1  . progesterone (PROMETRIUM) 100 MG capsule Take 100 mg by mouth daily.    . simvastatin (ZOCOR) 40 MG tablet TAKE ONE TABLET BY MOUTH DAILY AT 6 PM 90 tablet 3  . triamcinolone (NASACORT) 55 MCG/ACT AERO nasal inhaler Place 2 sprays into the nose daily. 1 Inhaler 12    No results found for this or any previous visit (from the past 48 hour(s)).  No results found.   A comprehensive review of systems was negative.  Blood pressure 110/86, pulse 63, temperature 98.1 F (36.7 C), temperature source Oral, resp. rate 16, height 5' 3.5" (1.613 m), weight 64 kg (141 lb), SpO2 100 %.  General appearance: alert, cooperative and appears stated age Head: Normocephalic, without obvious abnormality, atraumatic Neck: supple, symmetrical, trachea midline Resp: clear to auscultation bilaterally Cardio: regular rate and rhythm GI: non-tender Extremities: Intact sensation and capillary refill all digits.  +epl/fpl/io.  No wounds.  Pulses: 2+ and  symmetric Skin: Skin color, texture, turgor normal. No rashes or lesions Neurologic: Grossly normal Incision/Wound:none  Assessment/Plan Right thumb mucoid cyst and dip joint arthritis.  Non operative and operative treatment options were discussed with the patient and patient wishes to proceed with operative treatment. Risks, benefits, and alternatives of surgery were discussed and the patient agrees with the plan of care.   Calob Baskette R 01/21/2017, 8:42 AM

## 2017-01-21 NOTE — Op Note (Signed)
[id number] 

## 2017-01-21 NOTE — Discharge Instructions (Addendum)

## 2017-01-21 NOTE — Anesthesia Preprocedure Evaluation (Signed)
Anesthesia Evaluation  Patient identified by MRN, date of birth, ID band Patient awake    Reviewed: Allergy & Precautions, H&P , NPO status , Patient's Chart, lab work & pertinent test results  History of Anesthesia Complications (+) PONVNegative for: history of anesthetic complications  Airway Mallampati: II  TM Distance: >3 FB Neck ROM: full    Dental no notable dental hx. (+) Dental Advisory Given   Pulmonary neg pulmonary ROS,    Pulmonary exam normal breath sounds clear to auscultation       Cardiovascular negative cardio ROS Normal cardiovascular exam Rhythm:regular Rate:Normal     Neuro/Psych DDD/back pain negative neurological ROS     GI/Hepatic negative GI ROS, Neg liver ROS, GERD  Medicated,  Endo/Other  negative endocrine ROS  Renal/GU negative Renal ROS     Musculoskeletal  (+) Arthritis ,   Abdominal   Peds  Hematology negative hematology ROS (+)   Anesthesia Other Findings   Reproductive/Obstetrics negative OB ROS Pt reports she is menopausal                             Anesthesia Physical  Anesthesia Plan  ASA: II  Anesthesia Plan: General   Post-op Pain Management:    Induction: Intravenous  PONV Risk Score and Plan: 4 or greater and Ondansetron, Dexamethasone, Midazolam and Scopolamine patch - Pre-op  Airway Management Planned: LMA  Additional Equipment:   Intra-op Plan:   Post-operative Plan: Extubation in OR  Informed Consent: I have reviewed the patients History and Physical, chart, labs and discussed the procedure including the risks, benefits and alternatives for the proposed anesthesia with the patient or authorized representative who has indicated his/her understanding and acceptance.   Dental Advisory Given  Plan Discussed with: Anesthesiologist, CRNA and Surgeon  Anesthesia Plan Comments: (Consent by Acey Lav, MD)        Anesthesia  Quick Evaluation

## 2017-01-21 NOTE — Anesthesia Procedure Notes (Signed)
Procedure Name: LMA Insertion Date/Time: 01/21/2017 8:54 AM Performed by: Renella Cunas D Pre-anesthesia Checklist: Patient identified, Emergency Drugs available, Suction available and Patient being monitored Patient Re-evaluated:Patient Re-evaluated prior to induction Oxygen Delivery Method: Circle system utilized Preoxygenation: Pre-oxygenation with 100% oxygen Induction Type: IV induction Ventilation: Mask ventilation without difficulty LMA: LMA inserted LMA Size: 4.0 Number of attempts: 1 Airway Equipment and Method: Bite block Placement Confirmation: positive ETCO2 Tube secured with: Tape Dental Injury: Teeth and Oropharynx as per pre-operative assessment

## 2017-01-21 NOTE — Op Note (Signed)
NAME:  Reason, [name]                 ACCOUNT NO.:  [id number]  MEDICAL RECORD NO.:  [id number]  LOCATION:                                 FACILITY:  PHYSICIAN:  Betha Loa, MD             DATE OF BIRTH:  DATE OF PROCEDURE:  01/21/2017 DATE OF DISCHARGE:                              OPERATIVE REPORT   PREOPERATIVE DIAGNOSES:  Right thumb mucoid cyst and distal interphalangeal joint arthritis.  POSTOPERATIVE DIAGNOSIS:  Right thumb mucoid cyst and distal interphalangeal joint arthritis.  PROCEDURE:  Right thumb excision of mucoid cyst and debridement of IP joint.  SURGEON:  Betha Loa, MD.  ASSISTANT:  None.  ANESTHESIA:  General.  IV FLUIDS:  Per anesthesia flow sheet.  ESTIMATED BLOOD LOSS:  Minimal.  COMPLICATIONS:  None.  SPECIMENS:  Cyst to Pathology.  TOURNIQUET TIME:  15 minutes.  DISPOSITION:  Stable to PACU.  INDICATIONS:  [name] is a 46 year old female who has noted a mass in the dorsum of her right thumb.  It has caused nail deformity.  She wished to have it removed.  Risks, benefits, and alternatives of surgery were discussed including the risk of blood loss, infection, damage to nerves/vessels/tendons/ligaments/bone, failure of surgery, need for additional surgery, complications with wound healing, continued pain, and recurrence of mass.  She voiced understanding these risks and elected to proceed.  OPERATIVE COURSE:  After being identified preoperatively by myself, the patient and I agreed upon the procedure and site of procedure.  Surgical site was marked.  The risks, benefits, and alternatives of surgery were reviewed and she wished to proceed.  Surgical consent had been signed. She was given IV Ancef as preoperative antibiotic prophylaxis.  She was transferred to the operating room and placed on the operating room table in supine position with the right upper extremity on an arm board. General anesthesia was induced by anesthesiologist.  The  right upper extremity was prepped and draped in a normal sterile orthopedic fashion. Surgical pause was performed between surgeons, anesthesia, and operating room staff; and all were agreement as to the patient, procedure, and site of procedure.  Tourniquet at the proximal aspect of the extremity was inflated to 250 mmHg after exsanguination of the limb with an Esmarch bandage.  A hockey stick-shaped incision was made at the thumb IP joint and carried into subcutaneous tissues by spreading technique. The mass was easily identified.  It was cleared of soft tissue attachments.  It was filled with clear gelatinous fluid.  It was removed and sent to Pathology for examination.  The stalk was coming from the ulnar side of the IP joint.  The IP joint was entered and debrided with rongeurs.  Synovectomy was performed.  The wound and joint were copiously irrigated with sterile saline.  The wound was closed with 4-0 nylon in a horizontal mattress fashion.  A digital block had been performed with 9 mL of 0.25% plain Marcaine to aid in postoperative analgesia.  The wound was dressed with sterile Xeroform, 4 x 4's, and wrapped with a Coban dressing lightly.  Alumafoam splint was placed and wrapped lightly with Coban dressing.  Tourniquet was deflated at 15 minutes.  Fingertips were pink with brisk capillary refill after deflation of tourniquet.  Operative drapes were broken down and the patient was awoken from anesthesia safely.  She was transferred back to stretcher and taken to PACU in stable condition.  I will see her back in the office in 1 week for postoperative followup.  I will give her Norco 5/325 one to two p.o. q.6 hours p.r.n. pain, dispensed #20, and Zofran 4 mg p.o. q.8 hours p.r.n. nausea, dispensed #20.     Betha Loa, MD     KK/MEDQ  D:  01/21/2017  T:  01/21/2017  Job:  [id number]

## 2017-01-21 NOTE — Anesthesia Postprocedure Evaluation (Signed)
Anesthesia Post Note  Patient: Gladies Z Rothermel  Procedure(s) Performed: Procedure(s) (LRB): RIGHT THUMB EXCISION CYST AND DEBRIEDMENT DISTAL INTERPHALANGEAL JOINT (Right)     Patient location during evaluation: PACU Anesthesia Type: General Level of consciousness: awake and alert Pain management: pain level controlled Vital Signs Assessment: post-procedure vital signs reviewed and stable Respiratory status: spontaneous breathing, nonlabored ventilation and respiratory function stable Cardiovascular status: blood pressure returned to baseline and stable Postop Assessment: no apparent nausea or vomiting Anesthetic complications: no    Last Vitals:  Vitals:   01/21/17 0957 01/21/17 1009  BP: 108/68 101/64  Pulse: 70 62  Resp: 12 16  Temp: 36.4 C 36.6 C  SpO2: 100% 100%    Last Pain:  Vitals:   01/21/17 1009  TempSrc: Oral  PainSc: 0-No pain                 Lowella Curb

## 2017-01-22 ENCOUNTER — Encounter (HOSPITAL_BASED_OUTPATIENT_CLINIC_OR_DEPARTMENT_OTHER): Payer: Self-pay | Admitting: Orthopedic Surgery

## 2017-04-06 ENCOUNTER — Ambulatory Visit (INDEPENDENT_AMBULATORY_CARE_PROVIDER_SITE_OTHER): Payer: BLUE CROSS/BLUE SHIELD | Admitting: Internal Medicine

## 2017-04-06 ENCOUNTER — Encounter: Payer: Self-pay | Admitting: Internal Medicine

## 2017-04-06 VITALS — BP 116/70 | Temp 98.3°F | Ht 63.5 in | Wt 153.0 lb

## 2017-04-06 DIAGNOSIS — M25561 Pain in right knee: Secondary | ICD-10-CM | POA: Diagnosis not present

## 2017-04-06 MED ORDER — HYDROCODONE-ACETAMINOPHEN 5-325 MG PO TABS: ORAL_TABLET | ORAL | 0 refills | Status: DC

## 2017-04-06 MED ORDER — IBUPROFEN 800 MG PO TABS: 800.0000 mg | ORAL_TABLET | Freq: Three times a day (TID) | ORAL | 0 refills | Status: DC | PRN

## 2017-04-06 NOTE — Patient Instructions (Addendum)
Please take all new medication as prescribed - the ibuprofen and hydrocodone only if needed  Please make an appt with Sports Medicine for a knee ultrasound evaluatoin (in this office)  Please continue all other medications as before, and refills have been done if requested.  Please have the pharmacy call with any other refills you may need.  Please keep your appointments with your specialists as you may have planned

## 2017-04-07 ENCOUNTER — Encounter: Payer: Self-pay | Admitting: Internal Medicine

## 2017-04-07 DIAGNOSIS — M25561 Pain in right knee: Secondary | ICD-10-CM | POA: Insufficient documentation

## 2017-04-07 NOTE — Assessment & Plan Note (Signed)
With marked right joint line tenderness and swelling, suspicious for acute lateral meniscal tear mild to mod, for pain control, and refer sport medicine for likely u/s evaluation, may need ortho evaluation as well

## 2017-04-07 NOTE — Progress Notes (Signed)
**Note Angela-Identified via Obfuscation** Subjective:    Patient ID: Angela Webb, female    DOB: 08/10/1970, 46 y.o.   MRN: [id number]  HPI  Here to c/u with c/o right knee pain x 1 wk after trying to lift a heavy object overhead with husband assist but her right knee seemed to fold inwards medially as she fell left to the ground.  Did not seem immediately hurt but since has developed mod to severe lateral pain assoc with some swelling; worse at one point to kneeling to do something else, better to not do this, no further giveaways or falls.  Tylenol not helping well Past Medical History:  Diagnosis Date  . Allergic rhinitis   . ALLERGIC RHINITIS 10/03/2007  . Complication of anesthesia   . GERD (gastroesophageal reflux disease)   . Hyperlipidemia   . HYPERLIPIDEMIA 08/08/2008  . Medical history non-contributory   . Neuromuscular disorder (HCC)    DDD  . PONV (postoperative nausea and vomiting)    Past Surgical History:  Procedure Laterality Date  . EXCISION MASS UPPER EXTREMETIES Right 01/21/2017   Procedure: RIGHT THUMB EXCISION CYST AND DEBRIEDMENT DISTAL INTERPHALANGEAL JOINT;  Surgeon: Betha LoaKuzma, Kevin, MD;  Location: [facility];  Service: Orthopedics;  Laterality: Right;  . I&D EXTREMITY Left 08/22/2015   Procedure: IRRIGATION AND DEBRIDEMENT EXTREMITY;  Surgeon: Betha LoaKevin Kuzma, MD;  Location: [facility];  Service: Orthopedics;  Laterality: Left;  I & d left thumb  . INCISION AND DRAINAGE Left 08/29/2015   Procedure: REPEAT INCISION AND DRAINAGE LEFT THUMB;  Surgeon: Betha LoaKevin Kuzma, MD;  [facility];  Service: Orthopedics;  Laterality: Left;    reports that  has never smoked. she has never used smokeless tobacco. She reports that she drinks about 0.6 oz of alcohol per week. She reports that she does not use drugs. family history includes Arthritis in her mother; Diabetes in her brother. Allergies  Allergen Reactions  . Levofloxacin Anaphylaxis  . Levofloxacin     REACTION: LEG  PAIN/CRAMPS  . Tramadol Nausea Only   Current Outpatient Medications on File Prior to Visit  Medication Sig Dispense Refill  . celecoxib (CELEBREX) 200 MG capsule TAKE ONE CAPSULE BY MOUTH TWICE A DAY AS NEEDED 180 capsule 3  . cetirizine (ZYRTEC) 10 MG tablet Take 1 tablet (10 mg total) by mouth daily. 90 tablet 3  . cyclobenzaprine (FLEXERIL) 5 MG tablet TAKE 1 TABLET (5 MG TOTAL) BY MOUTH DAILY. 30 tablet 4  . diclofenac sodium (VOLTAREN) 1 % GEL Apply 4 g topically 4 (four) times daily as needed. 400 g 5  . gabapentin (NEURONTIN) 100 MG capsule TAKE 2 CAPSULES (200 MG TOTAL) BY MOUTH AT BEDTIMe 180 capsule 1  . ondansetron (ZOFRAN) 4 MG tablet Take 1 tablet (4 mg total) by mouth every 8 (eight) hours as needed for nausea or vomiting. 20 tablet 0  . pantoprazole (PROTONIX) 40 MG tablet TAKE 1 TABLET (40 MG TOTAL) BY MOUTH DAILY. 90 tablet 1  . progesterone (PROMETRIUM) 100 MG capsule Take 100 mg by mouth daily.    . simvastatin (ZOCOR) 40 MG tablet TAKE ONE TABLET BY MOUTH DAILY AT 6 PM 90 tablet 3  . triamcinolone (NASACORT) 55 MCG/ACT AERO nasal inhaler Place 2 sprays into the nose daily. 1 Inhaler 12   No current facility-administered medications on file prior to visit.    Review of Systems  All other system neg per pt    Objective:   Physical Exam BP 116/70  Temp 98.3 F (36.8 C) (Oral)   Ht 5' 3.5" (1.613 m)   Wt 153 lb (69.4 kg)   LMP 06/23/2012   BMI 26.68 kg/m  VS noted,  Constitutional: Pt appears in NAD HENT: Head: NCAT.  Right Ear: External ear normal.  Left Ear: External ear normal.  Eyes: . Pupils are equal, round, and reactive to light. Conjunctivae and EOM are normal Nose: without d/c or deformity Neck: Neck supple. Gross normal ROM Cardiovascular: Normal rate and regular rhythm.   Pulmonary/Chest: Effort normal and breath sounds without rales or wheezing.  Right knee with marked tender and mild swelling to the right medial joint line, mild decreased ROM,  neg lockmans Neurological: Pt is alert. At baseline orientation, motor grossly intact Skin: Skin is warm. No rashes, other new lesions, no LE edema Psychiatric: Pt behavior is normal without agitation  No other exam finding    Assessment & Plan:

## 2017-04-22 ENCOUNTER — Encounter: Payer: Self-pay | Admitting: Family Medicine

## 2017-04-22 ENCOUNTER — Ambulatory Visit (INDEPENDENT_AMBULATORY_CARE_PROVIDER_SITE_OTHER): Payer: BLUE CROSS/BLUE SHIELD | Admitting: Family Medicine

## 2017-04-22 DIAGNOSIS — M25561 Pain in right knee: Secondary | ICD-10-CM | POA: Diagnosis not present

## 2017-04-22 DIAGNOSIS — M7551 Bursitis of right shoulder: Secondary | ICD-10-CM | POA: Diagnosis not present

## 2017-04-22 MED ORDER — VITAMIN D (ERGOCALCIFEROL) 1.25 MG (50000 UNIT) PO CAPS: 50000.0000 [IU] | ORAL_CAPSULE | ORAL | 0 refills | Status: DC

## 2017-04-22 NOTE — Assessment & Plan Note (Signed)
Has a contusion.  No need for further workup.  Topical anti-inflammatories given, once weekly vitamin D to help with any type of bone bruising.  Patient will try over-the-counter topical anti-inflammatories.  Follow-up again in 4 weeks

## 2017-04-22 NOTE — Progress Notes (Signed)
Angela ScaleZach Shanikia Kernodle D.O.  Sports Medicine [address] [place], [place] [address] Phone: [phone] Subjective:      CC: Elbow and shoulder pain, right knee pain  BJY:NWGNFAOZHYHPI:Subjective  Angela Webb is a 46 y.o. female coming in right knee pain that started month ago when she fell. She cannot put pressure on that side. Most of the pain is on the outside of her knee.   Her elbow and shoulder also bother her. She said that she has had pain for 1 year. Most of her pain is coming from the bicep up but she said that the pain originated in the posterior aspect. She denies any numbness or tingling in the hand. Patient had right thumb surgery in September and states that she did have numbness in the arm and hand.   Onset- chronic Location- right knee and right elbow Duration- intermittent Character- dull Aggravating factors- kneeling on knee Reliving factors-  Therapies tried-  Severity-6 out of 10     Past Medical History:  Diagnosis Date  . Allergic rhinitis   . ALLERGIC RHINITIS 10/03/2007  . Complication of anesthesia   . GERD (gastroesophageal reflux disease)   . Hyperlipidemia   . HYPERLIPIDEMIA 08/08/2008  . Medical history non-contributory   . Neuromuscular disorder (HCC)    DDD  . PONV (postoperative nausea and vomiting)    Past Surgical History:  Procedure Laterality Date  . EXCISION MASS UPPER EXTREMETIES Right 01/21/2017   Procedure: RIGHT THUMB EXCISION CYST AND DEBRIEDMENT DISTAL INTERPHALANGEAL JOINT;  Surgeon: Betha LoaKuzma, Kevin, MD;  Location: [facility];  Service: Orthopedics;  Laterality: Right;  . I&D EXTREMITY Left 08/22/2015   Procedure: IRRIGATION AND DEBRIDEMENT EXTREMITY;  Surgeon: Betha LoaKevin Kuzma, MD;  Location: [facility];  Service: Orthopedics;  Laterality: Left;  I & d left thumb  . INCISION AND DRAINAGE Left 08/29/2015   Procedure: REPEAT INCISION AND DRAINAGE LEFT THUMB;  Surgeon: Betha LoaKevin Kuzma, MD;  Location: [facility];   Service: Orthopedics;  Laterality: Left;   Social History   Socioeconomic History  . Marital status: Married    Spouse name: None  . Number of children: 1  . Years of education: None  . Highest education level: None  Social Needs  . Financial resource strain: None  . Food insecurity - worry: None  . Food insecurity - inability: None  . Transportation needs - medical: None  . Transportation needs - non-medical: None  Occupational History  . Occupation: Nutritional therapistcustomer service    Employer: [employer]  Tobacco Use  . Smoking status: Never Smoker  . Smokeless tobacco: Never Used  Substance and Sexual Activity  . Alcohol use: Yes    Alcohol/week: 0.6 oz    Types: 1 Glasses of wine per week    Comment: social  . Drug use: No  . Sexual activity: Yes  Other Topics Concern  . None  Social History Narrative   ** Merged History Encounter **       Allergies  Allergen Reactions  . Levofloxacin Anaphylaxis  . Levofloxacin     REACTION: LEG PAIN/CRAMPS  . Tramadol Nausea Only   Family History  Problem Relation Age of Onset  . Arthritis Mother   . Diabetes Brother        type I, deceased     Past medical history, social, surgical and family history all reviewed in electronic medical record.  No pertanent information unless stated regarding to the chief complaint.   Review  of Systems:Review of systems updated and as accurate as of 04/22/17  No headache, visual changes, nausea, vomiting, diarrhea, constipation, dizziness, abdominal pain, skin rash, fevers, chills, night sweats, weight loss, swollen lymph nodes, body aches, joint swelling,, chest pain, shortness of breath, mood changes.  Positive muscle aches  Objective  Blood pressure 92/72, pulse 74, height 5' 3.5" (1.613 m), weight 144 lb (65.3 kg), last menstrual period 06/23/2012, SpO2 98 %. Systems examined below as of 04/22/17   General: No apparent distress alert and oriented x3 mood and affect normal, dressed  appropriately.  HEENT: Pupils equal, extraocular movements intact  Respiratory: Patient's speak in full sentences and does not appear short of breath  Cardiovascular: No lower extremity edema, non tender, no erythema  Skin: Warm dry intact with no signs of infection or rash on extremities or on axial skeleton.  Abdomen: Soft nontender  Neuro: Cranial nerves II through XII are intact, neurovascularly intact in all extremities with 2+ DTRs and 2+ pulses.  Lymph: No lymphadenopathy of posterior or anterior cervical chain or axillae bilaterally.  Gait normal with good balance and coordination.  MSK:  Non tender with full range of motion and good stability and symmetric strength and tone of , elbows, wrist, hip, knee and ankles bilaterally.  Shoulder: Right Inspection reveals no abnormalities, atrophy or asymmetry. Palpation is normal with no tenderness over AC joint or bicipital groove. ROM is full in all planes passively. Rotator cuff strength normal throughout. signs of impingement with positive Neer and Hawkin's tests, but negative empty can sign. Speeds and Yergason's tests normal. No labral pathology noted with negative Obrien's, negative clunk and good stability. Normal scapular function observed. No painful arc and no drop arm sign. No apprehension sign Contralateral shoulder unremarkable  Right knee exam shows the patient is tender over the patella.  Patient does have mild bruising still noted.  Full range.  Minimal crepitus.  Negative McMurray's.  After informed written and verbal consent, patient was seated on exam table. Right shoulder was prepped with alcohol swab and utilizing posterior approach, patient's right glenohumeral space was injected with 4:1  marcaine 0.5%: Kenalog 40mg /dL. Patient tolerated the procedure well without immediate complications.   [id number]; 15 additional minutes spent for Therapeutic exercises as stated in above notes.  This included exercises focusing on  stretching, strengthening, with significant focus on eccentric aspects.   Long term goals include an improvement in range of motion, strength, endurance as well as avoiding reinjury. Patient's frequency would include in 1-2 times a day, 3-5 times a week for a duration of 6-12 weeks.  Proper technique shown and discussed handout in great detail with ATC.  All questions were discussed and answered.  Shoulder Exercises that included:  Basic scapular stabilization to include adduction and depression of scapula Scaption, focusing on proper movement and good control Internal and External rotation utilizing a theraband, with elbow tucked at side entire time Rows with theraband which was given      Impression and Recommendations:     This Kenley required medical decision making of moderate complexity.      Note: This dictation was prepared with Dragon dictation along with smaller phrase technology. Any transcriptional errors that result from this process are unintentional.

## 2017-04-22 NOTE — Assessment & Plan Note (Signed)
Given injection today, home exercise, icing regimen, which activities to do which wants to avoid.  Encourage patient to try to increase activity slowly over the course the next several days.  Follow-up with me again 4 weeks.  Also work with Event organiserathletic trainer to learn home exercises in greater detail

## 2017-04-22 NOTE — Patient Instructions (Addendum)
Good to see you  Ice is your friend Ice 20 minutes 2 times daily. Usually after activity and before bed. Once weekly vitamin D for the knee.  Exercises 3 times a week.  For the knee also try arnica lotion 2 times a day until it feels better See me again in 4 weeks

## 2017-04-23 ENCOUNTER — Ambulatory Visit: Payer: Self-pay | Admitting: Internal Medicine

## 2017-05-11 DIAGNOSIS — H401132 Primary open-angle glaucoma, bilateral, moderate stage: Secondary | ICD-10-CM | POA: Diagnosis not present

## 2017-05-11 DIAGNOSIS — H401131 Primary open-angle glaucoma, bilateral, mild stage: Secondary | ICD-10-CM | POA: Diagnosis not present

## 2017-05-13 DIAGNOSIS — Z6825 Body mass index (BMI) 25.0-25.9, adult: Secondary | ICD-10-CM | POA: Diagnosis not present

## 2017-05-13 DIAGNOSIS — Z1231 Encounter for screening mammogram for malignant neoplasm of breast: Secondary | ICD-10-CM | POA: Diagnosis not present

## 2017-05-13 DIAGNOSIS — Z01419 Encounter for gynecological examination (general) (routine) without abnormal findings: Secondary | ICD-10-CM | POA: Diagnosis not present

## 2017-05-13 DIAGNOSIS — N951 Menopausal and female climacteric states: Secondary | ICD-10-CM | POA: Diagnosis not present

## 2017-05-21 ENCOUNTER — Telehealth: Payer: Self-pay | Admitting: Internal Medicine

## 2017-05-21 NOTE — Telephone Encounter (Signed)
Called pt, her VM has not been set up for messages.

## 2017-05-21 NOTE — Telephone Encounter (Signed)
We received labs per GYN with new moderately to severely elevated LDL cholesterol and slightly elevated sugar 131  Her labs incluiding the LDL were normal July 2018.    Please ask pt to follow strict low cholesterol, low fat , lower carb diet and keep the weight down, as if her next labs here are similar, she may need treatment  OK to offer a nurse visit for Hgba1c - hyperglycemia, to make sure no DM if pt would like this  We can also send to Nutrition counseling if she likes

## 2017-05-25 ENCOUNTER — Telehealth: Payer: Self-pay | Admitting: Internal Medicine

## 2017-05-25 NOTE — Telephone Encounter (Signed)
Dr. Jonny RuizJohn do you recall seeing any lab work for this patient? I don't recall any paperwork coming through for her recently.

## 2017-05-25 NOTE — Telephone Encounter (Signed)
Copied from CRM [id number]. Topic: Quick Communication - See Telephone Encounter >> May 25, 2017  4:04 PM Terisa Starraylor, Brittany L wrote: CRM for notification. See Telephone encounter for:   05/25/17.  Patient wants to know if her lab work was received from Dr. Dennie BibleHolland's office. (obgyn) Call back is [phone]

## 2017-05-25 NOTE — Telephone Encounter (Signed)
Yes, ok to refer to Jan 18 phone note b/c that's when it was addressed by me  Thanks!

## 2017-05-26 NOTE — Telephone Encounter (Signed)
Pt has been informed and expressed understanding.  

## 2017-05-26 NOTE — Telephone Encounter (Signed)
Ok to let pt  Know that these things are understandable, so I would encourage taking her medication as prescribed, and cont to work on lower [employer] diet and wt control; no need for further change at this time, thanks

## 2017-05-26 NOTE — Telephone Encounter (Signed)
Patient has called back.  I have read patient Dr. Raphael GibneyJohn's response on labs.  Patient states she believes her sugar was high b/c she had drunk a coke on the way over to the office that day.  She believes her LDL was bad due to not taking medications as she should.  Please follow back up with patient in regard.

## 2017-05-26 NOTE — Telephone Encounter (Signed)
Called pt, Vm is not set up. Called pt on work number, LVM for her to return my call at her earliest convenience to discuss lab results noted in 05/21/17 phone note.

## 2017-05-27 ENCOUNTER — Ambulatory Visit (INDEPENDENT_AMBULATORY_CARE_PROVIDER_SITE_OTHER): Payer: BLUE CROSS/BLUE SHIELD | Admitting: Family Medicine

## 2017-05-27 ENCOUNTER — Ambulatory Visit: Payer: Self-pay | Admitting: Internal Medicine

## 2017-05-27 ENCOUNTER — Encounter: Payer: Self-pay | Admitting: Family Medicine

## 2017-05-27 VITALS — BP 118/68 | HR 56 | Temp 98.7°F | Ht 63.5 in | Wt 144.0 lb

## 2017-05-27 DIAGNOSIS — M7712 Lateral epicondylitis, left elbow: Secondary | ICD-10-CM | POA: Diagnosis not present

## 2017-05-27 MED ORDER — DICLOFENAC SODIUM 2 % TD SOLN: 1.0000 "application " | Freq: Two times a day (BID) | TRANSDERMAL | 3 refills | Status: DC

## 2017-05-27 NOTE — Progress Notes (Signed)
Angela BonitoChristy Z Webb - 47 y.o. female MRN [id number]  Date of birth: 01/24/1971  SUBJECTIVE:  Including CC & ROS.  Chief Complaint  Patient presents with  . Left elbow pain    Angela Webb is a 47 y.o. female that is presenting with left elbow pain. Ongoing for two weeks. Denies trauma or injury. She has had previous tennis elbow in her right and had injections with improvement. She states the pain is a constant ache. Denies swelling or tenderness. She has been applying Biofreeze and using Aleve with no improvement. Denies decrease range of motion.  Denies any repetitive motion. She works on a Animatorcomputer at work and that can exacerbate her symptoms. No numbness or tingling. No inciting event.   Review of Systems  Constitutional: Negative for fever.  Respiratory: Negative for cough.   Cardiovascular: Negative for chest pain.  Gastrointestinal: Negative for abdominal pain.  Musculoskeletal: Negative for back pain.  Skin: Negative for color change.  Neurological: Negative for weakness.  Hematological: Negative for adenopathy.  Psychiatric/Behavioral: Negative for agitation.    HISTORY: Past Medical, Surgical, Social, and Family History Reviewed & Updated per EMR.   Pertinent Historical Findings include:  Past Medical History:  Diagnosis Date  . Allergic rhinitis   . ALLERGIC RHINITIS 10/03/2007  . Complication of anesthesia   . GERD (gastroesophageal reflux disease)   . Hyperlipidemia   . HYPERLIPIDEMIA 08/08/2008  . Medical history non-contributory   . Neuromuscular disorder (HCC)    DDD  . PONV (postoperative nausea and vomiting)     Past Surgical History:  Procedure Laterality Date  . EXCISION MASS UPPER EXTREMETIES Right 01/21/2017   Procedure: RIGHT THUMB EXCISION CYST AND DEBRIEDMENT DISTAL INTERPHALANGEAL JOINT;  Surgeon: Betha LoaKuzma, Kevin, MD;  Location: [facility];  Service: Orthopedics;  Laterality: Right;  . I&D EXTREMITY Left 08/22/2015   Procedure: IRRIGATION AND  DEBRIDEMENT EXTREMITY;  Surgeon: Betha LoaKevin Kuzma, MD;  Location: [facility];  Service: Orthopedics;  Laterality: Left;  I & d left thumb  . INCISION AND DRAINAGE Left 08/29/2015   Procedure: REPEAT INCISION AND DRAINAGE LEFT THUMB;  Surgeon: Betha LoaKevin Kuzma, MD;  Location: [facility];  Service: Orthopedics;  Laterality: Left;    Allergies  Allergen Reactions  . Levofloxacin Anaphylaxis  . Levofloxacin     REACTION: LEG PAIN/CRAMPS  . Tramadol Nausea Only    Family History  Problem Relation Age of Onset  . Arthritis Mother   . Diabetes Brother        type I, deceased     Social History   Socioeconomic History  . Marital status: Married    Spouse name: Not on file  . Number of children: 1  . Years of education: Not on file  . Highest education level: Not on file  Social Needs  . Financial resource strain: Not on file  . Food insecurity - worry: Not on file  . Food insecurity - inability: Not on file  . Transportation needs - medical: Not on file  . Transportation needs - non-medical: Not on file  Occupational History  . Occupation: Nutritional therapistcustomer service    Employer: [employer]  Tobacco Use  . Smoking status: Never Smoker  . Smokeless tobacco: Never Used  Substance and Sexual Activity  . Alcohol use: Yes    Alcohol/week: 0.6 oz    Types: 1 Glasses of wine per week    Comment: social  . Drug use: No  . Sexual activity: Yes  Other Topics Concern  . Not on file  Social History Narrative   ** Merged History Encounter **         PHYSICAL EXAM:  VS: BP 118/68 (BP Location: Left Arm, Patient Position: Sitting, Cuff Size: Normal)   Pulse (!) 56   Temp 98.7 F (37.1 C) (Oral)   Ht 5' 3.5" (1.613 m)   Wt 144 lb (65.3 kg)   LMP 06/23/2012   SpO2 98%   BMI 25.11 kg/m  Physical Exam Gen: NAD, alert, cooperative with exam, well-appearing ENT: normal lips, normal nasal mucosa,  Eye: normal EOM, normal conjunctiva and lids CV:  no edema, +2  pedal pulses   Resp: no accessory muscle use, non-labored,  Skin: no rashes, no areas of induration  Neuro: normal tone, normal sensation to touch Psych:  normal insight, alert and oriented MSK:  Left elbow:  TTP of the LE and common extensors  Normal elbow ROM  No swelling  Normal wrist ROM  Pain with resistance to middle finger extension.  Neurovascularly intact   Limited ultrasound: Left elbow:  No significant changes of the common extensors at the lateral epicondyles Normal dynamic testing of the radial head. Normal dynamic testing of the supinator No increased uptake with Doppler  Summary: Normal exam  Ultrasound and interpretation by Clare Gandy, MD      Aspiration/Injection Procedure Note Angela Webb 05/30/1970  Procedure: Injection Indications: Left elbow pain  Procedure Details Consent: Risks of procedure as well as the alternatives and risks of each were explained to the (patient/caregiver).  Consent for procedure obtained. Time Out: Verified patient identification, verified procedure, site/side was marked, verified correct patient position, special equipment/implants available, medications/allergies/relevent history reviewed, required imaging and test results available.  Performed.  The area was cleaned with iodine and alcohol swabs.    The left lateral epicondyle was injected using 1 cc's of 40 mg Depomedrol and 2 cc's of 0.5% benzocaine with a 25 1 1/2" needle.  Ultrasound was used. Images were obtained in Long views showing the injection.    A sterile dressing was applied.  Patient did tolerate procedure well.      ASSESSMENT & PLAN:   Lateral epicondylitis of left elbow Pain appears to lateral epicondylitis. No suggestion of supinator syndrome.  - injection  - pennsaid  - counseled on HEP  - if no improvement consider PT and/or nitro

## 2017-05-27 NOTE — Patient Instructions (Signed)
Please try the exercises I have provided.  Please try to ice the area.  Please try to avoid maneuvers that provoke the pain.

## 2017-05-28 DIAGNOSIS — M7712 Lateral epicondylitis, left elbow: Secondary | ICD-10-CM | POA: Insufficient documentation

## 2017-05-28 NOTE — Assessment & Plan Note (Signed)
Pain appears to lateral epicondylitis. No suggestion of supinator syndrome.  - injection  - pennsaid  - counseled on HEP  - if no improvement consider PT and/or nitro

## 2017-06-02 ENCOUNTER — Other Ambulatory Visit: Payer: Self-pay | Admitting: Internal Medicine

## 2017-06-22 ENCOUNTER — Ambulatory Visit (INDEPENDENT_AMBULATORY_CARE_PROVIDER_SITE_OTHER): Payer: BLUE CROSS/BLUE SHIELD | Admitting: Internal Medicine

## 2017-06-22 ENCOUNTER — Encounter: Payer: Self-pay | Admitting: Internal Medicine

## 2017-06-22 VITALS — BP 116/78 | HR 74 | Temp 98.6°F | Ht 63.5 in | Wt 142.0 lb

## 2017-06-22 DIAGNOSIS — J309 Allergic rhinitis, unspecified: Secondary | ICD-10-CM | POA: Diagnosis not present

## 2017-06-22 DIAGNOSIS — R040 Epistaxis: Secondary | ICD-10-CM

## 2017-06-22 DIAGNOSIS — H9191 Unspecified hearing loss, right ear: Secondary | ICD-10-CM

## 2017-06-22 DIAGNOSIS — E785 Hyperlipidemia, unspecified: Secondary | ICD-10-CM

## 2017-06-22 NOTE — Progress Notes (Signed)
**Note Angela-Identified via Obfuscation** Subjective:    Patient ID: Angela Webb, female    DOB: 01-09-1971, 47 y.o.   MRN: [id number]  HPI    Here with husband with c/o several episodes right nasal bleeding with first episode about 6 mo ago, then again several times more recently, now at least one per wk  Last wk had another episode of nosebleed while driving, felt some lightheaded but did not have to pull over.  Got to work and "didn't feel right all day", No fever, pain, or trauma.  No prior hx of same. Does have several wks ongoing nasal allergy symptoms with clearish congestion, itch and sneezing, without fever, pain, ST, cough, swelling or wheezing, takes zyrtec year round now and improved, and no recent steroid shots for allergies.  Zyrtec not too drying.  Seems to get nosebleeds in the winter with dryness and low humidity  Knee pain improved with tx per sports meds.  Has a cough with post nasal gtt with bleeding, quite upsetting. No anticoags or antiplt,  Does take alleve as needed but not recently.  Pt denies chest pain, increased sob or doe, wheezing, orthopnea, PND, increased LE swelling, palpitations, dizziness or syncope.  Also has some hearing loss on the right for 1 wk - ? wax Past Medical History:  Diagnosis Date  . Allergic rhinitis   . ALLERGIC RHINITIS 10/03/2007  . Complication of anesthesia   . GERD (gastroesophageal reflux disease)   . Hyperlipidemia   . HYPERLIPIDEMIA 08/08/2008  . Medical history non-contributory   . Neuromuscular disorder (HCC)    DDD  . PONV (postoperative nausea and vomiting)    Past Surgical History:  Procedure Laterality Date  . EXCISION MASS UPPER EXTREMETIES Right 01/21/2017   Procedure: RIGHT THUMB EXCISION CYST AND DEBRIEDMENT DISTAL INTERPHALANGEAL JOINT;  Surgeon: Betha Loa, MD;  Location: [facility];  Service: Orthopedics;  Laterality: Right;  . I&D EXTREMITY Left 08/22/2015   Procedure: IRRIGATION AND DEBRIDEMENT EXTREMITY;  Surgeon: Betha Loa, MD;  Location:  [facility];  Service: Orthopedics;  Laterality: Left;  I & d left thumb  . INCISION AND DRAINAGE Left 08/29/2015   Procedure: REPEAT INCISION AND DRAINAGE LEFT THUMB;  Surgeon: Betha Loa, MD;  Location: [facility];  Service: Orthopedics;  Laterality: Left;    reports that  has never smoked. she has never used smokeless tobacco. She reports that she drinks about 0.6 oz of alcohol per week. She reports that she does not use drugs. family history includes Arthritis in her mother; Diabetes in her brother. Allergies  Allergen Reactions  . Levofloxacin Anaphylaxis  . Levofloxacin     REACTION: LEG PAIN/CRAMPS  . Tramadol Nausea Only   Current Outpatient Medications on File Prior to Visit  Medication Sig Dispense Refill  . celecoxib (CELEBREX) 200 MG capsule TAKE ONE CAPSULE BY MOUTH TWICE A DAY AS NEEDED 180 capsule 3  . cetirizine (ZYRTEC) 10 MG tablet Take 1 tablet (10 mg total) by mouth daily. 90 tablet 3  . cyclobenzaprine (FLEXERIL) 5 MG tablet TAKE 1 TABLET (5 MG TOTAL) BY MOUTH DAILY. 30 tablet 4  . Diclofenac Sodium (PENNSAID) 2 % SOLN Place 1 application onto the skin 2 (two) times daily. 1 Bottle 3  . diclofenac sodium (VOLTAREN) 1 % GEL Apply 4 g topically 4 (four) times daily as needed. 400 g 5  . gabapentin (NEURONTIN) 100 MG capsule TAKE 2 CAPSULES (200 MG TOTAL) BY MOUTH AT BEDTIME 180 capsule 1  .  HYDROcodone-acetaminophen (NORCO) 5-325 MG tablet 1 tabs po q6 hours prn pain 28 tablet 0  . ibuprofen (ADVIL,MOTRIN) 800 MG tablet Take 1 tablet (800 mg total) by mouth every 8 (eight) hours as needed. 50 tablet 0  . ondansetron (ZOFRAN) 4 MG tablet Take 1 tablet (4 mg total) by mouth every 8 (eight) hours as needed for nausea or vomiting. 20 tablet 0  . pantoprazole (PROTONIX) 40 MG tablet TAKE 1 TABLET (40 MG TOTAL) BY MOUTH DAILY. 90 tablet 1  . progesterone (PROMETRIUM) 100 MG capsule Take 100 mg by mouth daily.    . simvastatin (ZOCOR) 40 MG tablet  TAKE ONE TABLET BY MOUTH DAILY AT 6 PM 90 tablet 3  . triamcinolone (NASACORT) 55 MCG/ACT AERO nasal inhaler Place 2 sprays into the nose daily. 1 Inhaler 12   No current facility-administered medications on file prior to visit.    Review of Systems  Constitutional: Negative for other unusual diaphoresis or sweats HENT: Negative for ear discharge or swelling Eyes: Negative for other worsening visual disturbances Respiratory: Negative for stridor or other swelling  Gastrointestinal: Negative for worsening distension or other blood Genitourinary: Negative for retention or other urinary change Musculoskeletal: Negative for other MSK pain or swelling Skin: Negative for color change or other new lesions Neurological: Negative for worsening tremors and other numbness  Psychiatric/Behavioral: Negative for worsening agitation or other fatigue All other system neg per pt    Objective:   Physical Exam BP 116/78   Pulse 74   Temp 98.6 F (37 C) (Oral)   Ht 5' 3.5" (1.613 m)   Wt 142 lb (64.4 kg)   LMP 06/23/2012   SpO2 97%   BMI 24.76 kg/m  VS noted, not ill appearing Constitutional: Pt appears in NAD HENT: Head: NCAT.  Right Ear: External ear normal. Right wax impaction resolved with irrigation Left Ear: External ear normal.  Eyes: . Pupils are equal, round, and reactive to light. Conjunctivae and EOM are normal Nose: without d/c or deformity, no bleeding, tender or swelling Neck: Neck supple. Gross normal ROM Cardiovascular: Normal rate and regular rhythm.   Pulmonary/Chest: Effort normal and breath sounds without rales or wheezing.  Neurological: Pt is alert. At baseline orientation, motor grossly intact Skin: Skin is warm. No rashes, other new lesions, no LE edema Psychiatric: Pt behavior is normal without agitation , mild nervous No other exam findings    Assessment & Plan:

## 2017-06-22 NOTE — Assessment & Plan Note (Signed)
stable overall by history and exam, recent data reviewed with pt, and pt to continue medical treatment as before,  to f/u any worsening symptoms or concerns Lab Results  Component Value Date   LDLCALC 95 11/18/2016   

## 2017-06-22 NOTE — Assessment & Plan Note (Signed)
Suspect friable right intranasal, to cont same meds except to possible change the zyrtec if may be too drying; to o/w cont same tx, refer ENT, may need cautery

## 2017-06-22 NOTE — Patient Instructions (Addendum)
Your ear was irrigated of wax today  You will be contacted regarding the referral for: ENT  You could think about changing the zyrtec to allegra which is less drying, or even nasacort but I am not sure this is really necessary  Please continue all other medications as before, and refills have been done if requested.  Please have the pharmacy call with any other refills you may need.  Please keep your appointments with your specialists as you may have planned  Please return in 6 months, or sooner if needed, with Lab testing done 3-5 days before

## 2017-06-22 NOTE — Assessment & Plan Note (Signed)
Resolved with wax impaction irrigation, hearing improved,  to f/u any worsening symptoms or concerns

## 2017-06-22 NOTE — Assessment & Plan Note (Signed)
Consider change zyrtec to allegra for any recurrence,  to f/u any worsening symptoms or concerns

## 2017-07-11 ENCOUNTER — Other Ambulatory Visit: Payer: Self-pay | Admitting: Family Medicine

## 2017-07-20 ENCOUNTER — Encounter: Payer: Self-pay | Admitting: Internal Medicine

## 2017-09-07 ENCOUNTER — Other Ambulatory Visit: Payer: Self-pay | Admitting: Internal Medicine

## 2017-10-04 ENCOUNTER — Other Ambulatory Visit: Payer: Self-pay | Admitting: Family Medicine

## 2017-10-04 NOTE — Telephone Encounter (Signed)
Refill done.  

## 2017-11-25 ENCOUNTER — Other Ambulatory Visit: Payer: Self-pay | Admitting: Internal Medicine

## 2017-12-03 ENCOUNTER — Other Ambulatory Visit: Payer: Self-pay | Admitting: Internal Medicine

## 2017-12-04 ENCOUNTER — Other Ambulatory Visit: Payer: Self-pay | Admitting: Internal Medicine

## 2017-12-14 ENCOUNTER — Other Ambulatory Visit: Payer: Self-pay | Admitting: Family Medicine

## 2018-01-16 ENCOUNTER — Other Ambulatory Visit: Payer: Self-pay | Admitting: Internal Medicine

## 2018-02-08 ENCOUNTER — Ambulatory Visit (INDEPENDENT_AMBULATORY_CARE_PROVIDER_SITE_OTHER): Payer: BLUE CROSS/BLUE SHIELD | Admitting: Family

## 2018-02-08 ENCOUNTER — Other Ambulatory Visit: Payer: Self-pay | Admitting: Family

## 2018-02-08 ENCOUNTER — Ambulatory Visit (INDEPENDENT_AMBULATORY_CARE_PROVIDER_SITE_OTHER)
Admission: RE | Admit: 2018-02-08 | Discharge: 2018-02-08 | Disposition: A | Discharge status: Home / Self Care | Payer: BLUE CROSS/BLUE SHIELD | Source: Ambulatory Visit | Attending: Family | Admitting: Family

## 2018-02-08 ENCOUNTER — Encounter: Payer: Self-pay | Admitting: Family

## 2018-02-08 ENCOUNTER — Other Ambulatory Visit: Payer: BLUE CROSS/BLUE SHIELD

## 2018-02-08 DIAGNOSIS — R1032 Left lower quadrant pain: Secondary | ICD-10-CM

## 2018-02-08 DIAGNOSIS — K529 Noninfective gastroenteritis and colitis, unspecified: Secondary | ICD-10-CM

## 2018-02-08 DIAGNOSIS — R109 Unspecified abdominal pain: Secondary | ICD-10-CM | POA: Diagnosis not present

## 2018-02-08 LAB — POC URINALSYSI DIPSTICK (AUTOMATED)
Bilirubin, UA: NEGATIVE
Glucose, UA: NEGATIVE
KETONES UA: NEGATIVE
Leukocytes, UA: NEGATIVE
Nitrite, UA: NEGATIVE
PH UA: 6 (ref 5.0–8.0)
PROTEIN UA: NEGATIVE
RBC UA: NEGATIVE
SPEC GRAV UA: 1.01 (ref 1.010–1.025)
Urobilinogen, UA: 0.2 E.U./dL

## 2018-02-08 MED ORDER — METRONIDAZOLE 500 MG PO TABS: 500.0000 mg | ORAL_TABLET | Freq: Three times a day (TID) | ORAL | 0 refills | Status: DC

## 2018-02-08 NOTE — Progress Notes (Signed)
Angela Webb is a 47 y.o. female with the following history as recorded in EpicCare:  Patient Active Problem List   Diagnosis Date Noted  . Frequent nosebleeds 06/22/2017  . Acute hearing loss, right 06/22/2017  . Lateral epicondylitis of left elbow 05/28/2017  . Acute shoulder bursitis, right 04/22/2017  . Right knee pain 04/07/2017  . Early menopause 11/18/2016  . Thumb pain, right 11/05/2016  . Right arm pain 08/21/2015  . Low back pain 08/21/2015  . Lumbar degenerative disc disease 11/06/2014  . Hot flashes 06/05/2014  . Acute upper respiratory infection 06/05/2014  . Right shoulder pain 06/05/2014  . Bilateral leg pain 01/26/2014  . Lower back pain 08/22/2012  . GERD (gastroesophageal reflux disease) 08/20/2011  . Preventative health care 07/28/2010  . Hyperlipidemia 08/08/2008  . Allergic rhinitis 10/03/2007  . VERTIGO 10/03/2007    Current Outpatient Medications  Medication Sig Dispense Refill  . celecoxib (CELEBREX) 200 MG capsule TAKE ONE CAPSULE BY MOUTH TWICE A DAY AS NEEDED 180 capsule 3  . cetirizine (ZYRTEC) 10 MG tablet Take 1 tablet (10 mg total) by mouth daily. 90 tablet 3  . cyclobenzaprine (FLEXERIL) 5 MG tablet Take 1 tablet (5 mg total) by mouth daily. **ANNUAL APPOINTMENT NEEDED FOR ADDITIONAL REFILLS** 30 tablet 0  . Diclofenac Sodium (PENNSAID) 2 % SOLN Place 1 application onto the skin 2 (two) times daily. 1 Bottle 3  . diclofenac sodium (VOLTAREN) 1 % GEL Apply 4 g topically 4 (four) times daily as needed. 400 g 5  . estradiol (ESTRACE) 1 MG tablet Take 1 mg by mouth daily.  6  . gabapentin (NEURONTIN) 100 MG capsule TAKE 2 CAPSULES (200 MG TOTAL) BY MOUTH AT BEDTIME 180 capsule 0  . HYDROcodone-acetaminophen (NORCO) 5-325 MG tablet 1 tabs po q6 hours prn pain 28 tablet 0  . ibuprofen (ADVIL,MOTRIN) 800 MG tablet Take 1 tablet (800 mg total) by mouth every 8 (eight) hours as needed. 50 tablet 0  . ondansetron (ZOFRAN) 4 MG tablet Take 1 tablet (4 mg  total) by mouth every 8 (eight) hours as needed for nausea or vomiting. 20 tablet 0  . pantoprazole (PROTONIX) 40 MG tablet TAKE 1 TABLET (40 MG TOTAL) BY MOUTH DAILY. 90 tablet 1  . progesterone (PROMETRIUM) 100 MG capsule Take 100 mg by mouth daily.    . simvastatin (ZOCOR) 40 MG tablet TAKE ONE TABLET BY MOUTH DAILY AT 6 PM 90 tablet 1  . triamcinolone (NASACORT) 55 MCG/ACT AERO nasal inhaler Place 2 sprays into the nose daily. 1 Inhaler 12  . Vitamin D, Ergocalciferol, (DRISDOL) 50000 units CAPS capsule TAKE 1 CAPSULE (50,000 UNITS TOTAL) BY MOUTH EVERY 7 DAYS. 12 capsule 0   No current facility-administered medications for this visit.     Allergies: Levofloxacin; Levofloxacin; and Tramadol  Past Medical History:  Diagnosis Date  . Allergic rhinitis   . ALLERGIC RHINITIS 10/03/2007  . Complication of anesthesia   . GERD (gastroesophageal reflux disease)   . Hyperlipidemia   . HYPERLIPIDEMIA 08/08/2008  . Medical history non-contributory   . Neuromuscular disorder (HCC)    DDD  . PONV (postoperative nausea and vomiting)     Past Surgical History:  Procedure Laterality Date  . EXCISION MASS UPPER EXTREMETIES Right 01/21/2017   Procedure: RIGHT THUMB EXCISION CYST AND DEBRIEDMENT DISTAL INTERPHALANGEAL JOINT;  Surgeon: Betha Loa, MD;  Location: [facility];  Service: Orthopedics;  Laterality: Right;  . I&D EXTREMITY Left 08/22/2015   Procedure: IRRIGATION AND  DEBRIDEMENT EXTREMITY;  Surgeon: Betha Loa, MD;  Location: [facility];  Service: Orthopedics;  Laterality: Left;  I & d left thumb  . INCISION AND DRAINAGE Left 08/29/2015   Procedure: REPEAT INCISION AND DRAINAGE LEFT THUMB;  Surgeon: Betha Loa, MD;  Location: [facility];  Service: Orthopedics;  Laterality: Left;    Family History  Problem Relation Age of Onset  . Arthritis Mother   . Diabetes Brother        type I, deceased    Social History   Tobacco Use  . Smoking status:  Never Smoker  . Smokeless tobacco: Never Used  Substance Use Topics  . Alcohol use: Yes    Alcohol/week: 1.0 standard drinks    Types: 1 Glasses of wine per week    Comment: social    Subjective:  Patient notes that she came home yesterday from work with abdominal pain/ "burning in mid-stomach"; tried eating "TUMS" last night to help with abdominal pain; has had more frequent bowel movements in the past 12 hours but denies any bloody diarrhea; + fever; she notes that last night she began to experience severe left sided flank pain and urinary symptoms; notes that she threw up this morning and some of the stomach pain has improved; feels that the flank pain is not affected by movement/ it 'just happens" and causes me to double over in pain. Husband is present for visit and helps provide information; both patient and husband are very concerned about a possible kidney stone due to severity of pain and secondary urinary symptoms;   LMP- post-menopausal Objective:  Vitals:   02/08/18 1428  BP: 114/80  Pulse: (!) 57  Temp: 97.7 F (36.5 C)  TempSrc: Oral  SpO2: 99%  Weight: 130 lb 1.3 oz (59 kg)  Height: 5' 3.5" (1.613 m)    General: Well developed, well nourished, in no acute distress  Skin : Warm and dry. No rash noted along left flank Head: Normocephalic and atraumatic  Eyes: Sclera and conjunctiva clear; pupils round and reactive to light; extraocular movements intact  Ears: External normal; canals clear; tympanic membranes normal  Oropharynx: Pink, supple. No suspicious lesions  Neck: Supple without thyromegaly, adenopathy  Lungs: Respirations unlabored; clear to auscultation bilaterally without wheeze, rales, rhonchi  CVS exam: normal rate and regular rhythm.  Abdomen: Soft; nontender; nondistended; normoactive bowel sounds; no masses or hepatosplenomegaly  Musculoskeletal: No deformities; no active joint inflammation  Extremities: No edema, cyanosis, clubbing  Vessels: Symmetric  bilaterally  Neurologic: Alert and oriented; speech intact; face symmetrical; moves all extremities well; CNII-XII intact without focal deficit  Assessment:  1. Left lower quadrant abdominal pain     Plan:  Patient is very concerned for kidney stone- will update renal stone CT; if this is normal, suspect symptoms are viral in nature and symptoms will resolve on their own; follow-up to be determined.   No follow-ups on file.  Orders Placed This Encounter  Procedures  . CT RENAL STONE STUDY    SS> Lori [phone]/ [employer]     Standing Status:   Future    Number of Occurrences:   1    Standing Expiration Date:   05/12/2019    Order Specific Question:   Is patient pregnant?    Answer:   No    Order Specific Question:   Preferred imaging location?    Answer:   [facility]    Order Specific Question:   Radiology  Contrast Protocol - do NOT remove file path    Answer:   [email or web address]    Requested Prescriptions    No prescriptions requested or ordered in this encounter

## 2018-02-08 NOTE — Addendum Note (Signed)
Addended by: Karma Ganja on: 02/08/2018 04:10 PM   Modules accepted: Orders

## 2018-02-08 NOTE — Addendum Note (Signed)
Addended by: Scarlett Presto on: 02/08/2018 04:04 PM   Modules accepted: Orders

## 2018-02-09 ENCOUNTER — Telehealth: Payer: Self-pay | Admitting: Internal Medicine

## 2018-02-09 LAB — URINE CULTURE
MICRO NUMBER:: 91209845
SPECIMEN QUALITY: ADEQUATE

## 2018-02-09 NOTE — Telephone Encounter (Signed)
I called patient and left message that urine culture was not back yet.

## 2018-02-09 NOTE — Telephone Encounter (Signed)
Copied from CRM [id number]. Topic: Quick Communication - See Telephone Encounter >> Feb 09, 2018 11:44 AM Angela Nevin wrote: CRM for notification. See Telephone encounter for: 02/09/18.  Pt calling to check status of lab results from 10/8.

## 2018-02-15 ENCOUNTER — Ambulatory Visit (INDEPENDENT_AMBULATORY_CARE_PROVIDER_SITE_OTHER): Payer: BLUE CROSS/BLUE SHIELD | Admitting: Internal Medicine

## 2018-02-15 ENCOUNTER — Encounter: Payer: Self-pay | Admitting: Internal Medicine

## 2018-02-15 ENCOUNTER — Other Ambulatory Visit (INDEPENDENT_AMBULATORY_CARE_PROVIDER_SITE_OTHER): Payer: BLUE CROSS/BLUE SHIELD

## 2018-02-15 VITALS — BP 104/76 | HR 81 | Temp 97.6°F | Ht 63.5 in | Wt 131.0 lb

## 2018-02-15 DIAGNOSIS — K529 Noninfective gastroenteritis and colitis, unspecified: Secondary | ICD-10-CM

## 2018-02-15 LAB — CBC WITH DIFFERENTIAL/PLATELET
BASOS PCT: 0.6 % (ref 0.0–3.0)
Basophils Absolute: 0 10*3/uL (ref 0.0–0.1)
EOS ABS: 0.1 10*3/uL (ref 0.0–0.7)
Eosinophils Relative: 1.7 % (ref 0.0–5.0)
HEMATOCRIT: 38.5 % (ref 36.0–46.0)
Hemoglobin: 13 g/dL (ref 12.0–15.0)
Lymphocytes Relative: 30.3 % (ref 12.0–46.0)
Lymphs Abs: 2.1 10*3/uL (ref 0.7–4.0)
MCHC: 33.9 g/dL (ref 30.0–36.0)
MCV: 93.5 fl (ref 78.0–100.0)
MONO ABS: 0.4 10*3/uL (ref 0.1–1.0)
Monocytes Relative: 6.1 % (ref 3.0–12.0)
NEUTROS ABS: 4.3 10*3/uL (ref 1.4–7.7)
Neutrophils Relative %: 61.3 % (ref 43.0–77.0)
PLATELETS: 299 10*3/uL (ref 150.0–400.0)
RBC: 4.12 Mil/uL (ref 3.87–5.11)
RDW: 12.9 % (ref 11.5–15.5)
WBC: 7 10*3/uL (ref 4.0–10.5)

## 2018-02-15 LAB — HEPATIC FUNCTION PANEL
ALT: 30 U/L (ref 0–35)
AST: 27 U/L (ref 0–37)
Albumin: 4.4 g/dL (ref 3.5–5.2)
Alkaline Phosphatase: 48 U/L (ref 39–117)
BILIRUBIN DIRECT: 0.1 mg/dL (ref 0.0–0.3)
BILIRUBIN TOTAL: 0.4 mg/dL (ref 0.2–1.2)
TOTAL PROTEIN: 6.8 g/dL (ref 6.0–8.3)

## 2018-02-15 LAB — LIPASE: Lipase: 22 U/L (ref 11.0–59.0)

## 2018-02-15 LAB — BASIC METABOLIC PANEL
BUN: 11 mg/dL (ref 6–23)
CHLORIDE: 106 meq/L (ref 96–112)
CO2: 29 mEq/L (ref 19–32)
Calcium: 9.2 mg/dL (ref 8.4–10.5)
Creatinine, Ser: 0.83 mg/dL (ref 0.40–1.20)
GFR: 77.7 mL/min (ref >60.00)
Glucose, Bld: 101 mg/dL — ABNORMAL HIGH (ref 70–99)
Potassium: 3.9 mEq/L (ref 3.5–5.1)
SODIUM: 140 meq/L (ref 135–145)

## 2018-02-15 LAB — URINALYSIS, ROUTINE W REFLEX MICROSCOPIC
BILIRUBIN URINE: NEGATIVE
HGB URINE DIPSTICK: NEGATIVE
KETONES UR: NEGATIVE
LEUKOCYTES UA: NEGATIVE
Nitrite: NEGATIVE
RBC / HPF: NONE SEEN (ref 0–?)
Specific Gravity, Urine: <=1.005 — AB (ref 1.000–1.030)
TOTAL PROTEIN, URINE-UPE24: NEGATIVE
URINE GLUCOSE: NEGATIVE
Urobilinogen, UA: 0.2 (ref 0.0–1.0)
WBC UA: NONE SEEN (ref 0–?)
pH: 6 (ref 5.0–8.0)

## 2018-02-15 MED ORDER — AMOXICILLIN-POT CLAVULANATE 875-125 MG PO TABS: 1.0000 | ORAL_TABLET | Freq: Two times a day (BID) | ORAL | 0 refills | Status: DC

## 2018-02-15 MED ORDER — ONDANSETRON HCL 4 MG PO TABS: 4.0000 mg | ORAL_TABLET | Freq: Three times a day (TID) | ORAL | 0 refills | Status: DC | PRN

## 2018-02-15 NOTE — Assessment & Plan Note (Addendum)
Etiology unclear, will finish flagyl, but also for augmentin course asd, zofran prn, for f/u lab today as ordered, refer GI, may need colonoscopy for further evaluation

## 2018-02-15 NOTE — Patient Instructions (Addendum)
OK to finish the flagyl antibiotic  Please take all new medication as prescribed  - the augmentin, and the zofran  Please continue all other medications as before, and refills have been done if requested.  Please have the pharmacy call with any other refills you may need.  You will be contacted regarding the referral for: Gastroenterology  Please keep your appointments with your specialists as you may have planned  Please go to the LAB in the Basement (turn left off the elevator) for the tests to be done today  You will be contacted by phone if any changes need to be made immediately.  Otherwise, you will receive a letter about your results with an explanation, but please check with MyChart first.  Please remember to sign up for MyChart if you have not done so, as this will be important to you in the future with finding out test results, communicating by private email, and scheduling acute appointments online when needed.

## 2018-02-15 NOTE — Progress Notes (Signed)
**Note Angela-Identified via Obfuscation** Subjective:    Patient ID: Angela Webb, female    DOB: 15-Aug-1970, 47 y.o.   MRN: [id number]  HPI  Here to f/u recent colitis unclear etiology, presented with nausea, abd pain and confirmed incidentally with CT renal also neg for stone; has no worsening fever or s/s sepsis, but abd pain and nausea persist. Did have tx with flagyl though has had some difficulty tolerating due to taste.  Usual BM pattern has changed to very infrequent with last BM loose 3 days ago, no blood.  No vomiting, chills.  Pt denies chest pain, increased sob or doe, wheezing, orthopnea, PND, increased LE swelling, palpitations, dizziness or syncope.   Pt denies polydipsia, polyuria  Husband has 2 friends with inflammatory colitis and is very keen on considering this with referral today Past Medical History:  Diagnosis Date  . Allergic rhinitis   . ALLERGIC RHINITIS 10/03/2007  . Complication of anesthesia   . GERD (gastroesophageal reflux disease)   . Hyperlipidemia   . HYPERLIPIDEMIA 08/08/2008  . Medical history non-contributory   . Neuromuscular disorder (HCC)    DDD  . PONV (postoperative nausea and vomiting)    Past Surgical History:  Procedure Laterality Date  . EXCISION MASS UPPER EXTREMETIES Right 01/21/2017   Procedure: RIGHT THUMB EXCISION CYST AND DEBRIEDMENT DISTAL INTERPHALANGEAL JOINT;  Surgeon: Betha Loa, MD;  Location: [facility];  Service: Orthopedics;  Laterality: Right;  . I&D EXTREMITY Left 08/22/2015   Procedure: IRRIGATION AND DEBRIDEMENT EXTREMITY;  Surgeon: Betha Loa, MD;  Location: [facility];  Service: Orthopedics;  Laterality: Left;  I & d left thumb  . INCISION AND DRAINAGE Left 08/29/2015   Procedure: REPEAT INCISION AND DRAINAGE LEFT THUMB;  Surgeon: Betha Loa, MD;  Location: [facility];  Service: Orthopedics;  Laterality: Left;    reports that she has never smoked. She has never used smokeless tobacco. She reports that she drinks about  1.0 standard drinks of alcohol per week. She reports that she does not use drugs. family history includes Arthritis in her mother; Diabetes in her brother. Allergies  Allergen Reactions  . Levofloxacin Anaphylaxis  . Levofloxacin     REACTION: LEG PAIN/CRAMPS  . Tramadol Nausea Only   Current Outpatient Medications on File Prior to Visit  Medication Sig Dispense Refill  . celecoxib (CELEBREX) 200 MG capsule TAKE ONE CAPSULE BY MOUTH TWICE A DAY AS NEEDED 180 capsule 3  . cetirizine (ZYRTEC) 10 MG tablet Take 1 tablet (10 mg total) by mouth daily. 90 tablet 3  . cyclobenzaprine (FLEXERIL) 5 MG tablet Take 1 tablet (5 mg total) by mouth daily. **ANNUAL APPOINTMENT NEEDED FOR ADDITIONAL REFILLS** 30 tablet 0  . Diclofenac Sodium (PENNSAID) 2 % SOLN Place 1 application onto the skin 2 (two) times daily. 1 Bottle 3  . diclofenac sodium (VOLTAREN) 1 % GEL Apply 4 g topically 4 (four) times daily as needed. 400 g 5  . estradiol (ESTRACE) 1 MG tablet Take 1 mg by mouth daily.  6  . gabapentin (NEURONTIN) 100 MG capsule TAKE 2 CAPSULES (200 MG TOTAL) BY MOUTH AT BEDTIME 180 capsule 0  . HYDROcodone-acetaminophen (NORCO) 5-325 MG tablet 1 tabs po q6 hours prn pain 28 tablet 0  . ibuprofen (ADVIL,MOTRIN) 800 MG tablet Take 1 tablet (800 mg total) by mouth every 8 (eight) hours as needed. 50 tablet 0  . metroNIDAZOLE (FLAGYL) 500 MG tablet Take 1 tablet (500 mg total) by mouth 3 (three) times  daily. 21 tablet 0  . pantoprazole (PROTONIX) 40 MG tablet TAKE 1 TABLET (40 MG TOTAL) BY MOUTH DAILY. 90 tablet 1  . progesterone (PROMETRIUM) 100 MG capsule Take 100 mg by mouth daily.    . simvastatin (ZOCOR) 40 MG tablet TAKE ONE TABLET BY MOUTH DAILY AT 6 PM 90 tablet 1  . triamcinolone (NASACORT) 55 MCG/ACT AERO nasal inhaler Place 2 sprays into the nose daily. 1 Inhaler 12  . Vitamin D, Ergocalciferol, (DRISDOL) 50000 units CAPS capsule TAKE 1 CAPSULE (50,000 UNITS TOTAL) BY MOUTH EVERY 7 DAYS. 12 capsule 0     No current facility-administered medications on file prior to visit.    Review of Systems  Constitutional: Negative for other unusual diaphoresis or sweats HENT: Negative for ear discharge or swelling Eyes: Negative for other worsening visual disturbances Respiratory: Negative for stridor or other swelling  Gastrointestinal: Negative for worsening distension or other blood Genitourinary: Negative for retention or other urinary change Musculoskeletal: Negative for other MSK pain or swelling Skin: Negative for color change or other new lesions Neurological: Negative for worsening tremors and other numbness  Psychiatric/Behavioral: Negative for worsening agitation or other fatigue All other system neg per pt    Objective:   Physical Exam BP 104/76   Pulse 81   Temp 97.6 F (36.4 C) (Oral)   Ht 5' 3.5" (1.613 m)   Wt 131 lb (59.4 kg)   LMP 06/23/2012   SpO2 95%   BMI 22.84 kg/m  VS noted, mild ill but non toxic Constitutional: Pt appears in NAD HENT: Head: NCAT.  Right Ear: External ear normal.  Left Ear: External ear normal.  Eyes: . Pupils are equal, round, and reactive to light. Conjunctivae and EOM are normal Nose: without d/c or deformity Neck: Neck supple. Gross normal ROM Cardiovascular: Normal rate and regular rhythm.   Pulmonary/Chest: Effort normal and breath sounds without rales or wheezing.  Abd:  Soft, ND, + BS, no organomegaly but with mild to mod mid abd tender without guarding or rebound Neurological: Pt is alert. At baseline orientation, motor grossly intact Skin: Skin is warm. No rashes, other new lesions, no LE edema Psychiatric: Pt behavior is normal without agitation  No other exam findings     Assessment & Plan:

## 2018-02-21 ENCOUNTER — Other Ambulatory Visit: Payer: Self-pay | Admitting: Internal Medicine

## 2018-02-21 NOTE — Telephone Encounter (Signed)
Copied from CRM [id number]. Topic: Quick Communication - Rx Refill/Question >> Feb 21, 2018  4:09 PM Stephannie Li, NT wrote: Medication: cyclobenzaprine (FLEXERIL) 5 MG tablet , had an appointment on 02/15/18  Has the patient contacted their pharmacy? yes  (Agent: If no, request that the patient contact the pharmacy for the refill. (Agent: If yes, when and what did the pharmacy advise?  Preferred Pharmacy (with phone number or street name [id number] [place], [place] - [address] [phone] (Phone) [phone] (Fax)    Agent: Please be advised that RX refills may take up to 3 business days. We ask that you follow-up with your pharmacy.

## 2018-02-22 MED ORDER — CYCLOBENZAPRINE HCL 5 MG PO TABS: 5.0000 mg | ORAL_TABLET | Freq: Every day | ORAL | 0 refills | Status: DC

## 2018-02-22 NOTE — Telephone Encounter (Signed)
Requested medication (s) are due for refill today: yes  Requested medication (s) are on the active medication list: yes  Last refill:  11/26/17  Future visit scheduled: no (past appt 02/15/18)  Notes to clinic:     Requested Prescriptions  Pending Prescriptions Disp Refills   cyclobenzaprine (FLEXERIL) 5 MG tablet 30 tablet 0    Sig: Take 1 tablet (5 mg total) by mouth daily. **ANNUAL APPOINTMENT NEEDED FOR ADDITIONAL REFILLS**     Not Delegated - Analgesics:  Muscle Relaxants Failed - 02/21/2018  4:12 PM      Failed - This refill cannot be delegated      Failed - Valid encounter within last 6 months    Recent Outpatient Visits          1 week ago Colitis   [facility] -Clair Gulling, MD   2 weeks ago Left lower quadrant abdominal pain   [facility] -Shanna Cisco, Allyne Gee, FNP   8 months ago Allergic rhinitis, unspecified seasonality, unspecified trigger   [facility] -Clair Gulling, MD   9 months ago Lateral epicondylitis of left elbow    [facility] -[facility] Mans[facility] Marye Round, MD   10 months ago Acute pain of right knee   [facility] -Elam Judi Saa, DO

## 2018-04-01 ENCOUNTER — Ambulatory Visit: Payer: Self-pay | Admitting: *Deleted

## 2018-04-01 NOTE — Telephone Encounter (Signed)
Summary: ondansetron (ZOFRAN) 4 MG tablet [place] medication    Caller name: Truss,Jeffrey Relation to pt: spouse (pt is with spouse)  Call back number: [phone]  [facility] [address] [place], [place], [place] [phone]  Reason for call:  Patient currently out of town and states she left her ondansetron (ZOFRAN) 4 MG tablet at home. Patient states she's nausea due to car sickness and she would like 5 pills sent in to the pharmacy, please advise

## 2018-04-01 NOTE — Telephone Encounter (Signed)
Pt husband returned call stating that his wife has been nauseous. I was able to speak with patient who states they flew into [place] today and forgot her zofran.  Her motion sickness started with a headache which she took aleve.  After that the sickness started. She states she has a Hx of motion sickness but this is severe.  She denies and other symptoms except the initial headache.  She is able to sip fluids and symptoms are subsiding. Pt is requesting 5 Zofran tablets be sent to a local pharmacy in [place].  Pt was advised to go to an urgent care in the area for medication and further evaluation. Pt verbalized understanding of all instructions. Reason for Disposition . Treating motion sickness symptoms, questions about  Answer Assessment - Initial Assessment Questions 1. SYMPTOMS: "What's the worst symptom? Describe it for me."     Motion sickness 2. SEVERITY: "How bad is it?" "Can you walk?"     No problems 3. ONSET: "When did the motion sickness begin?"     today 4. RECURRENT SYMPTOM: "Have you had motion sickness before?" If so, ask: "When was the last time?" "What happened that time?"     Hx of motion sickness 5. CAUSE: "What do you think caused the motion sickness?" "When did you stop doing that?"     travel 6. OTHER SYMPTOMS: "Do you have any other symptoms?" (e.g., headache, weakness, numbness, vomiting, earache)     Headache first sign too aleve 7. PREGNANCY: "Is there any chance you are pregnant?" "When was your last menstrual period?".     No early menapause  Protocols used: MOTION SICKNESS-A-AH

## 2018-04-01 NOTE — Telephone Encounter (Signed)
[facility] [phone]. Left VM to return call to office

## 2018-04-04 NOTE — Telephone Encounter (Signed)
Noted  

## 2018-04-07 ENCOUNTER — Encounter: Payer: Self-pay | Admitting: Internal Medicine

## 2018-04-14 ENCOUNTER — Encounter: Payer: BLUE CROSS/BLUE SHIELD | Admitting: Internal Medicine

## 2018-04-14 DIAGNOSIS — Z0289 Encounter for other administrative examinations: Secondary | ICD-10-CM

## 2018-04-25 ENCOUNTER — Encounter: Payer: Self-pay | Admitting: Internal Medicine

## 2018-05-03 ENCOUNTER — Ambulatory Visit: Payer: BLUE CROSS/BLUE SHIELD | Admitting: Family Medicine

## 2018-05-05 ENCOUNTER — Ambulatory Visit: Payer: BLUE CROSS/BLUE SHIELD | Admitting: Family Medicine

## 2018-06-09 ENCOUNTER — Ambulatory Visit (INDEPENDENT_AMBULATORY_CARE_PROVIDER_SITE_OTHER): Payer: 59 | Admitting: Internal Medicine

## 2018-06-09 ENCOUNTER — Encounter: Payer: Self-pay | Admitting: Internal Medicine

## 2018-06-09 VITALS — BP 112/62 | HR 72 | Temp 97.8°F | Ht 63.5 in | Wt 127.0 lb

## 2018-06-09 DIAGNOSIS — R6889 Other general symptoms and signs: Secondary | ICD-10-CM | POA: Diagnosis not present

## 2018-06-09 DIAGNOSIS — K529 Noninfective gastroenteritis and colitis, unspecified: Secondary | ICD-10-CM | POA: Diagnosis not present

## 2018-06-09 LAB — POCT INFLUENZA A/B
INFLUENZA A, POC: NEGATIVE
INFLUENZA B, POC: NEGATIVE

## 2018-06-09 MED ORDER — ONDANSETRON HCL 4 MG PO TABS: 4.0000 mg | ORAL_TABLET | Freq: Three times a day (TID) | ORAL | 1 refills | Status: DC | PRN

## 2018-06-09 NOTE — Patient Instructions (Signed)
Your influenza testing was negative  Please take all new medication as prescribed - the nausea medication if needed  OK to start the BRAT diet and advance as tolerated  Ok for Immodium or Kaopectate for any further diarrhea  You are given the work note  Please continue all other medications as before, and refills have been done if requested.  Please have the pharmacy call with any other refills you may need.  Please keep your appointments with your specialists as you may have planned

## 2018-06-09 NOTE — Progress Notes (Signed)
**Note Angela-Identified via Obfuscation** Subjective:    Patient ID: Angela Webb, female    DOB: 12/27/1970, 48 y.o.   MRN: [id number]  HPI  Here to f/u with c/o acute onset feverish, malaise, general weakness, loss of appetite, several lbs wt loss with nausea, crampy abd pain and multiple watery diarrhea episodes, made appt to come in and coincidently better today as had relatively normal BM this am and no vomiting or fever  Has had exposure to children with similar illness for whom she told (but does not seem proven) to have norovirus infections.  Pt denies chest pain, increased sob or doe, wheezing, orthopnea, PND, increased LE swelling, palpitations, dizziness or syncope.  Pt denies new neurological symptoms such as new headache, or facial or extremity weakness or numbness   Pt denies polydipsia, polyuria Past Medical History:  Diagnosis Date  . Allergic rhinitis   . ALLERGIC RHINITIS 10/03/2007  . Complication of anesthesia   . GERD (gastroesophageal reflux disease)   . Hyperlipidemia   . HYPERLIPIDEMIA 08/08/2008  . Medical history non-contributory   . Neuromuscular disorder (HCC)    DDD  . PONV (postoperative nausea and vomiting)    Past Surgical History:  Procedure Laterality Date  . EXCISION MASS UPPER EXTREMETIES Right 01/21/2017   Procedure: RIGHT THUMB EXCISION CYST AND DEBRIEDMENT DISTAL INTERPHALANGEAL JOINT;  Surgeon: Betha Loa, MD;  Location: [facility];  Service: Orthopedics;  Laterality: Right;  . I&D EXTREMITY Left 08/22/2015   Procedure: IRRIGATION AND DEBRIDEMENT EXTREMITY;  Surgeon: Betha Loa, MD;  Location: [facility];  Service: Orthopedics;  Laterality: Left;  I & d left thumb  . INCISION AND DRAINAGE Left 08/29/2015   Procedure: REPEAT INCISION AND DRAINAGE LEFT THUMB;  Surgeon: Betha Loa, MD;  Location: [facility];  Service: Orthopedics;  Laterality: Left;    reports that she has never smoked. She has never used smokeless tobacco. She reports current  alcohol use of about 1.0 standard drinks of alcohol per week. She reports that she does not use drugs. family history includes Arthritis in her mother; Diabetes in her brother. Allergies  Allergen Reactions  . Levofloxacin Anaphylaxis  . Levofloxacin     REACTION: LEG PAIN/CRAMPS  . Tramadol Nausea Only   Current Outpatient Medications on File Prior to Visit  Medication Sig Dispense Refill  . amoxicillin-clavulanate (AUGMENTIN) 875-125 MG tablet Take 1 tablet by mouth 2 (two) times daily. 20 tablet 0  . celecoxib (CELEBREX) 200 MG capsule TAKE ONE CAPSULE BY MOUTH TWICE A DAY AS NEEDED 180 capsule 3  . cetirizine (ZYRTEC) 10 MG tablet Take 1 tablet (10 mg total) by mouth daily. 90 tablet 3  . cyclobenzaprine (FLEXERIL) 5 MG tablet Take 1 tablet (5 mg total) by mouth daily. **ANNUAL APPOINTMENT NEEDED FOR ADDITIONAL REFILLS** 30 tablet 0  . Diclofenac Sodium (PENNSAID) 2 % SOLN Place 1 application onto the skin 2 (two) times daily. 1 Bottle 3  . diclofenac sodium (VOLTAREN) 1 % GEL Apply 4 g topically 4 (four) times daily as needed. 400 g 5  . estradiol (ESTRACE) 1 MG tablet Take 1 mg by mouth daily.  6  . gabapentin (NEURONTIN) 100 MG capsule TAKE 2 CAPSULES (200 MG TOTAL) BY MOUTH AT BEDTIME 180 capsule 0  . HYDROcodone-acetaminophen (NORCO) 5-325 MG tablet 1 tabs po q6 hours prn pain 28 tablet 0  . ibuprofen (ADVIL,MOTRIN) 800 MG tablet Take 1 tablet (800 mg total) by mouth every 8 (eight) hours as needed. 50 tablet  0  . metroNIDAZOLE (FLAGYL) 500 MG tablet Take 1 tablet (500 mg total) by mouth 3 (three) times daily. 21 tablet 0  . pantoprazole (PROTONIX) 40 MG tablet TAKE 1 TABLET (40 MG TOTAL) BY MOUTH DAILY. 90 tablet 1  . progesterone (PROMETRIUM) 100 MG capsule Take 100 mg by mouth daily.    . simvastatin (ZOCOR) 40 MG tablet TAKE ONE TABLET BY MOUTH DAILY AT 6 PM 90 tablet 1  . triamcinolone (NASACORT) 55 MCG/ACT AERO nasal inhaler Place 2 sprays into the nose daily. 1 Inhaler 12  .  Vitamin D, Ergocalciferol, (DRISDOL) 50000 units CAPS capsule TAKE 1 CAPSULE (50,000 UNITS TOTAL) BY MOUTH EVERY 7 DAYS. 12 capsule 0   No current facility-administered medications on file prior to visit.    Review of Systems  Constitutional: Negative for other unusual diaphoresis or sweats HENT: Negative for ear discharge or swelling Eyes: Negative for other worsening visual disturbances Respiratory: Negative for stridor or other swelling  Gastrointestinal: Negative for worsening distension or other blood Genitourinary: Negative for retention or other urinary change Musculoskeletal: Negative for other MSK pain or swelling Skin: Negative for color change or other new lesions Neurological: Negative for worsening tremors and other numbness  Psychiatric/Behavioral: Negative for worsening agitation or other fatigue All other system neg per pt    Objective:   Physical Exam BP 112/62   Pulse 72   Temp 97.8 F (36.6 C) (Oral)   Ht 5' 3.5" (1.613 m)   Wt 127 lb (57.6 kg)   LMP 06/23/2012   SpO2 98%   BMI 22.14 kg/m  VS noted, fatigued, mild ill Constitutional: Pt appears in NAD HENT: Head: NCAT.  Right Ear: External ear normal.  Left Ear: External ear normal.  Bilat tm's with mild erythema.  Max sinus areas mild tender.  Pharynx with mild erythema, no exudate Eyes: . Pupils are equal, round, and reactive to light. Conjunctivae and EOM are normal Nose: without d/c or deformity Neck: Neck supple. Gross normal ROM Cardiovascular: Normal rate and regular rhythm.   Pulmonary/Chest: Effort normal and breath sounds without rales or wheezing.  Abd:  Soft, NT, ND, + BS, no organomegaly Neurological: Pt is alert. At baseline orientation, motor grossly intact Skin: Skin is warm. No rashes, other new lesions, no LE edema Psychiatric: Pt behavior is normal without agitation  No other exam findings  Rapid flu test POCT -     Assessment & Plan:

## 2018-06-09 NOTE — Assessment & Plan Note (Addendum)
D/w pt, likely viral and possibly norovirus, but as she is now improved today, I think we can hold on formal testing such as the GI panel or other labs, for zofran prn,  Advance diet as tolerated, to f/u any worsening symptoms or concerns

## 2018-07-08 ENCOUNTER — Encounter: Payer: Self-pay | Admitting: Internal Medicine

## 2018-07-08 ENCOUNTER — Ambulatory Visit (INDEPENDENT_AMBULATORY_CARE_PROVIDER_SITE_OTHER): Payer: 59 | Admitting: Internal Medicine

## 2018-07-08 VITALS — BP 102/60 | HR 66 | Temp 98.5°F | Ht 63.5 in | Wt 131.0 lb

## 2018-07-08 DIAGNOSIS — Z0000 Encounter for general adult medical examination without abnormal findings: Secondary | ICD-10-CM | POA: Diagnosis not present

## 2018-07-08 DIAGNOSIS — R252 Cramp and spasm: Secondary | ICD-10-CM | POA: Insufficient documentation

## 2018-07-08 DIAGNOSIS — E785 Hyperlipidemia, unspecified: Secondary | ICD-10-CM | POA: Diagnosis not present

## 2018-07-08 MED ORDER — CYCLOBENZAPRINE HCL 5 MG PO TABS
5.0000 mg | ORAL_TABLET | Freq: Every day | ORAL | 1 refills | Status: DC
Start: 2018-07-08 — End: 2019-07-18

## 2018-07-08 MED ORDER — ROSUVASTATIN CALCIUM 10 MG PO TABS: 10.0000 mg | ORAL_TABLET | Freq: Every day | ORAL | 3 refills | Status: DC

## 2018-07-08 NOTE — Assessment & Plan Note (Signed)
?   Statin related, for flexeril prn, statin change as above

## 2018-07-08 NOTE — Progress Notes (Signed)
**Note Angela-Identified via Obfuscation** Subjective:    Patient ID: Angela Webb, female    DOB: 1971/01/18, 48 y.o.   MRN: [id number]  HPI  Here for wellness and f/u;  Overall doing ok;  Pt denies Chest pain, worsening SOB, DOE, wheezing, orthopnea, PND, worsening LE edema, palpitations, dizziness or syncope.  Pt denies neurological change such as new headache, facial or extremity weakness.  Pt denies polydipsia, polyuria, or low sugar symptoms. Pt states overall good compliance with treatment and medications, good tolerability, and has been trying to follow appropriate diet.  Pt denies worsening depressive symptoms, suicidal ideation or panic. No fever, night sweats, wt loss, loss of appetite, or other constitutional symptoms.  Pt states good ability with ADL's, has low fall risk, home safety reviewed and adequate, no other significant changes in hearing or vision, and only occasionally active with exercise. Wt Readings from Last 3 Encounters:  07/08/18 131 lb (59.4 kg)  06/09/18 127 lb (57.6 kg)  02/15/18 131 lb (59.4 kg)   BP Readings from Last 3 Encounters:  07/08/18 102/60  06/09/18 112/62  02/15/18 104/76  Also with significant bilat leg cramps anytime during the day, worse at night.  GI symptoms from last visit now resolved Past Medical History:  Diagnosis Date  . Allergic rhinitis   . ALLERGIC RHINITIS 10/03/2007  . Complication of anesthesia   . GERD (gastroesophageal reflux disease)   . Hyperlipidemia   . HYPERLIPIDEMIA 08/08/2008  . Medical history non-contributory   . Neuromuscular disorder (HCC)    DDD  . PONV (postoperative nausea and vomiting)    Past Surgical History:  Procedure Laterality Date  . EXCISION MASS UPPER EXTREMETIES Right 01/21/2017   Procedure: RIGHT THUMB EXCISION CYST AND DEBRIEDMENT DISTAL INTERPHALANGEAL JOINT;  Surgeon: Betha Loa, MD;  Location: [facility];  Service: Orthopedics;  Laterality: Right;  . I&D EXTREMITY Left 08/22/2015   Procedure: IRRIGATION AND DEBRIDEMENT  EXTREMITY;  Surgeon: Betha Loa, MD;  Location: [facility];  Service: Orthopedics;  Laterality: Left;  I & d left thumb  . INCISION AND DRAINAGE Left 08/29/2015   Procedure: REPEAT INCISION AND DRAINAGE LEFT THUMB;  Surgeon: Betha Loa, MD;  Location: [facility];  Service: Orthopedics;  Laterality: Left;    reports that she has never smoked. She has never used smokeless tobacco. She reports current alcohol use of about 1.0 standard drinks of alcohol per week. She reports that she does not use drugs. family history includes Arthritis in her mother; Diabetes in her brother. Allergies  Allergen Reactions  . Levofloxacin Anaphylaxis  . Levofloxacin     REACTION: LEG PAIN/CRAMPS  . Tramadol Nausea Only   Current Outpatient Medications on File Prior to Visit  Medication Sig Dispense Refill  . celecoxib (CELEBREX) 200 MG capsule TAKE ONE CAPSULE BY MOUTH TWICE A DAY AS NEEDED 180 capsule 3  . estradiol (ESTRACE) 1 MG tablet Take 1 mg by mouth daily.  6  . HYDROcodone-acetaminophen (NORCO) 5-325 MG tablet 1 tabs po q6 hours prn pain 28 tablet 0  . ondansetron (ZOFRAN) 4 MG tablet Take 1 tablet (4 mg total) by mouth every 8 (eight) hours as needed for nausea or vomiting. 30 tablet 1  . progesterone (PROMETRIUM) 100 MG capsule Take 100 mg by mouth daily.    . Vitamin D, Ergocalciferol, (DRISDOL) 50000 units CAPS capsule TAKE 1 CAPSULE (50,000 UNITS TOTAL) BY MOUTH EVERY 7 DAYS. 12 capsule 0   No current facility-administered medications on file prior to  visit.    Review of Systems Constitutional: Negative for other unusual diaphoresis, sweats, appetite or weight changes HENT: Negative for other worsening hearing loss, ear pain, facial swelling, mouth sores or neck stiffness.   Eyes: Negative for other worsening pain, redness or other visual disturbance.  Respiratory: Negative for other stridor or swelling Cardiovascular: Negative for other palpitations or other chest  pain  Gastrointestinal: Negative for worsening diarrhea or loose stools, blood in stool, distention or other pain Genitourinary: Negative for hematuria, flank pain or other change in urine volume.  Musculoskeletal: Negative for myalgias or other joint swelling.  Skin: Negative for other color change, or other wound or worsening drainage.  Neurological: Negative for other syncope or numbness. Hematological: Negative for other adenopathy or swelling Psychiatric/Behavioral: Negative for hallucinations, other worsening agitation, SI, self-injury, or new decreased concentration All other system neg per pt    Objective:   Physical Exam BP 102/60   Pulse 66   Temp 98.5 F (36.9 C) (Oral)   Ht 5' 3.5" (1.613 m)   Wt 131 lb (59.4 kg)   LMP 06/23/2012   SpO2 93%   BMI 22.84 kg/m  VS noted,  Constitutional: Pt is oriented to person, place, and time. Appears well-developed and well-nourished, in no significant distress and comfortable Head: Normocephalic and atraumatic  Eyes: Conjunctivae and EOM are normal. Pupils are equal, round, and reactive to light Right Ear: External ear normal without discharge Left Ear: External ear normal without discharge Nose: Nose without discharge or deformity Mouth/Throat: Oropharynx is without other ulcerations and moist  Neck: Normal range of motion. Neck supple. No JVD present. No tracheal deviation present or significant neck LA or mass Cardiovascular: Normal rate, regular rhythm, normal heart sounds and intact distal pulses.   Pulmonary/Chest: WOB normal and breath sounds without rales or wheezing  Abdominal: Soft. Bowel sounds are normal. NT. No HSM  Musculoskeletal: Normal range of motion. Exhibits no edema Lymphadenopathy: Has no other cervical adenopathy.  Neurological: Pt is alert and oriented to person, place, and time. Pt has normal reflexes. No cranial nerve deficit. Motor grossly intact, Gait intact Skin: Skin is warm and dry. No rash noted or  new ulcerations Psychiatric:  Has normal mood and affect. Behavior is normal without agitation No other exam findings Lab Results  Component Value Date   WBC 7.0 02/15/2018   HGB 13.0 02/15/2018   HCT 38.5 02/15/2018   PLT 299.0 02/15/2018   GLUCOSE 101 (H) 02/15/2018   CHOL 201 (H) 11/18/2016   TRIG 117.0 11/18/2016   HDL 82.90 11/18/2016   LDLDIRECT 108.8 08/22/2012   LDLCALC 95 11/18/2016   ALT 30 02/15/2018   AST 27 02/15/2018   NA 140 02/15/2018   K 3.9 02/15/2018   CL 106 02/15/2018   CREATININE 0.83 02/15/2018   BUN 11 02/15/2018   CO2 29 02/15/2018   TSH 1.86 11/18/2016       Assessment & Plan:

## 2018-07-08 NOTE — Patient Instructions (Addendum)
Ok to stop the current simvastatin for cholesterol  Please take all new medication as prescribed - the crestor 10 mg - to start in 2 wks after stopping the simvastatin  In 6 weeks - Please go to the LAB in the Basement (turn left off the elevator) for the tests  Please continue all other medications as before, and refills have been done if requested - the flexeril  Please have the pharmacy call with any other refills you may need.  Please continue your efforts at being more active, low cholesterol diet, and weight control.  You are otherwise up to date with prevention measures today.  Please keep your appointments with your specialists as you may have planned  Please return in 1 year for your yearly visit, or sooner if needed, with Lab testing done 3-5 days before

## 2018-07-08 NOTE — Assessment & Plan Note (Signed)
Ok to stop simvastatin , the change to crestor 10 qd in 2 weeks, with f/u lab 4 wks later

## 2018-07-08 NOTE — Assessment & Plan Note (Signed)

## 2018-08-05 ENCOUNTER — Telehealth: Payer: Self-pay | Admitting: Internal Medicine

## 2018-08-05 MED ORDER — HYDROCODONE-ACETAMINOPHEN 5-325 MG PO TABS: ORAL_TABLET | ORAL | 0 refills | Status: DC

## 2018-08-05 NOTE — Telephone Encounter (Signed)
Copied from CRM [id number]. Topic: Quick Communication - Rx Refill/Question >> Aug 05, 2018 10:56 AM Maia Petties wrote: Medication: HYDROcodone-acetaminophen (NORCO) 10-325 MG tablet  - pt out celecoxib (CELEBREX) 200 MG capsule - pt out  Has the patient contacted their pharmacy? No - pt/husband states they have talked to Dr. Jonny Ruiz regarding DDD in low back and leg pain (documented for years per pt husband). Pt is in tremendous pain and during her period the pain elevates. She is almost unable to function. Pt is not able to sleep due to pain. OTC naproxen, tylenol, and ibuprofen do not help the pain. Pt and husband are fine with signing controlled substance agreement or anything needed. They need help to control pain level.  Preferred Pharmacy (with phone number or street name): [id number] [place], [place] - [address] [phone] (Phone) [phone] (Fax)

## 2018-08-05 NOTE — Addendum Note (Signed)
Addended by: Corwin Levins on: 08/05/2018 12:05 PM   Modules accepted: Orders

## 2018-08-05 NOTE — Telephone Encounter (Signed)
Ok for norco refill, but will need virtual visit (or inperson visit) for further refills, as we may need to consider imaging such as LS spine MRI and/or orthopedic or pain management referral (as I do not treat chronic pain as part of my practice)

## 2018-08-05 NOTE — Telephone Encounter (Signed)
Called pt, LVM.   

## 2018-10-10 ENCOUNTER — Other Ambulatory Visit: Payer: Self-pay | Admitting: Obstetrics and Gynecology

## 2018-10-10 DIAGNOSIS — R928 Other abnormal and inconclusive findings on diagnostic imaging of breast: Secondary | ICD-10-CM

## 2018-10-17 ENCOUNTER — Other Ambulatory Visit: Payer: Self-pay | Admitting: Internal Medicine

## 2018-12-28 ENCOUNTER — Other Ambulatory Visit (INDEPENDENT_AMBULATORY_CARE_PROVIDER_SITE_OTHER): Payer: 59

## 2018-12-28 ENCOUNTER — Other Ambulatory Visit: Payer: Self-pay

## 2018-12-28 ENCOUNTER — Encounter: Payer: Self-pay | Admitting: Internal Medicine

## 2018-12-28 ENCOUNTER — Ambulatory Visit (INDEPENDENT_AMBULATORY_CARE_PROVIDER_SITE_OTHER): Payer: 59 | Admitting: Internal Medicine

## 2018-12-28 VITALS — BP 122/80 | HR 98 | Temp 97.9°F | Ht 63.5 in | Wt 131.0 lb

## 2018-12-28 DIAGNOSIS — I83812 Varicose veins of left lower extremities with pain: Secondary | ICD-10-CM | POA: Diagnosis not present

## 2018-12-28 DIAGNOSIS — R252 Cramp and spasm: Secondary | ICD-10-CM

## 2018-12-28 DIAGNOSIS — E785 Hyperlipidemia, unspecified: Secondary | ICD-10-CM

## 2018-12-28 DIAGNOSIS — T148XXA Other injury of unspecified body region, initial encounter: Secondary | ICD-10-CM | POA: Insufficient documentation

## 2018-12-28 LAB — CBC WITH DIFFERENTIAL/PLATELET
Basophils Absolute: 0 10*3/uL (ref 0.0–0.1)
Basophils Relative: 0.6 % (ref 0.0–3.0)
Eosinophils Absolute: 0 10*3/uL (ref 0.0–0.7)
Eosinophils Relative: 0.7 % (ref 0.0–5.0)
HCT: 37.8 % (ref 36.0–46.0)
Hemoglobin: 12.6 g/dL (ref 12.0–15.0)
Lymphocytes Relative: 37 % (ref 12.0–46.0)
Lymphs Abs: 2.3 10*3/uL (ref 0.7–4.0)
MCHC: 33.4 g/dL (ref 30.0–36.0)
MCV: 94.5 fl (ref 78.0–100.0)
Monocytes Absolute: 0.4 10*3/uL (ref 0.1–1.0)
Monocytes Relative: 5.7 % (ref 3.0–12.0)
Neutro Abs: 3.4 10*3/uL (ref 1.4–7.7)
Neutrophils Relative %: 56 % (ref 43.0–77.0)
Platelets: 298 10*3/uL (ref 150.0–400.0)
RBC: 4 Mil/uL (ref 3.87–5.11)
RDW: 12.3 % (ref 11.5–15.5)
WBC: 6.2 10*3/uL (ref 4.0–10.5)

## 2018-12-28 LAB — LIPID PANEL
Cholesterol: 195 mg/dL (ref 0–200)
HDL: 79.9 mg/dL (ref >39.00)
LDL Cholesterol: 84 mg/dL (ref 0–99)
NonHDL: 115.28
Total CHOL/HDL Ratio: 2
Triglycerides: 157 mg/dL — ABNORMAL HIGH (ref 0.0–149.0)
VLDL: 31.4 mg/dL (ref 0.0–40.0)

## 2018-12-28 LAB — TSH: TSH: 0.96 u[IU]/mL (ref 0.35–4.50)

## 2018-12-28 LAB — BASIC METABOLIC PANEL
BUN: 12 mg/dL (ref 6–23)
CO2: 28 mEq/L (ref 19–32)
Calcium: 9.6 mg/dL (ref 8.4–10.5)
Chloride: 102 mEq/L (ref 96–112)
Creatinine, Ser: 0.88 mg/dL (ref 0.40–1.20)
GFR: 68.09 mL/min (ref >60.00)
Glucose, Bld: 94 mg/dL (ref 70–99)
Potassium: 3.8 mEq/L (ref 3.5–5.1)
Sodium: 138 mEq/L (ref 135–145)

## 2018-12-28 LAB — HEPATIC FUNCTION PANEL
ALT: 13 U/L (ref 0–35)
AST: 16 U/L (ref 0–37)
Albumin: 4.7 g/dL (ref 3.5–5.2)
Alkaline Phosphatase: 47 U/L (ref 39–117)
Bilirubin, Direct: 0.1 mg/dL (ref 0.0–0.3)
Total Bilirubin: 0.5 mg/dL (ref 0.2–1.2)
Total Protein: 7.3 g/dL (ref 6.0–8.3)

## 2018-12-28 LAB — PROTIME-INR
INR: 1.1 ratio — ABNORMAL HIGH (ref 0.8–1.0)
Prothrombin Time: 12.6 s (ref 9.6–13.1)

## 2018-12-28 LAB — MAGNESIUM: Magnesium: 1.9 mg/dL (ref 1.5–2.5)

## 2018-12-28 NOTE — Assessment & Plan Note (Signed)
[facility] for vascular surgury referral due to pain

## 2018-12-28 NOTE — Patient Instructions (Signed)
Please continue all other medications as before, and refills have been done if requested.  Please have the pharmacy call with any other refills you may need.  Please continue your efforts at being more active, low cholesterol diet, and weight control.  Please keep your appointments with your specialists as you may have planned  You will be contacted regarding the referral for: Vascular surgury for painful varicose vein  Please go to the LAB in the Basement (turn left off the elevator) for the tests to be done today  You will be contacted by phone if any changes need to be made immediately.  Otherwise, you will receive a letter about your results with an explanation, but please check with MyChart first.  Please remember to sign up for MyChart if you have not done so, as this will be important to you in the future with finding out test results, communicating by private email, and scheduling acute appointments online when needed.  Please return in 6 months, or sooner if needed

## 2018-12-28 NOTE — Assessment & Plan Note (Signed)
For INR and plts with labs today but suspect she is simply slowly healing,  to f/u any worsening symptoms or concerns

## 2018-12-28 NOTE — Assessment & Plan Note (Signed)
Ok for magnesium with labs, doubt the statin is related as cramping has been a chronic issue for years

## 2018-12-28 NOTE — Progress Notes (Signed)
Subjective:    Patient ID: Angela Webb, female    DOB: [birth year], 48 y.o.   MRN: [id number]  HPI  Here to f/u a painful area to left lateral leg just below the knee that seems to be associated with a dimpled area, and husband looking online is concerned she has some kind of cancer.  Also c/o bruising to the arms after lifting some heavy things to the basement 3 wks ago that are still not quite healed.  Has no other brusing or bleeding.  Also c/o bilat leg and other cramps that seem to occur mostly at night but also during the day especially to the hands sometimes.  Denies overuse,  Pt denies fever, wt loss, night sweats, loss of appetite, or other constitutional symptoms  Pt denies chest pain, increased sob or doe, wheezing, orthopnea, PND, increased LE swelling, palpitations, dizziness or syncope.  Pt denies new neurological symptoms such as new headache, or facial or extremity weakness or numbness   Pt denies polydipsia, polyuria Past Medical History:  Diagnosis Date  . Allergic rhinitis   . ALLERGIC RHINITIS 10/03/2007  . Complication of anesthesia   . GERD (gastroesophageal reflux disease)   . Hyperlipidemia   . HYPERLIPIDEMIA 08/08/2008  . Medical history non-contributory   . Neuromuscular disorder ([place])    DDD  . PONV (postoperative nausea and vomiting)    Past Surgical History:  Procedure Laterality Date  . EXCISION MASS UPPER EXTREMETIES Right 01/21/2017   Procedure: RIGHT THUMB EXCISION CYST AND [place] DISTAL INTERPHALANGEAL JOINT;  Surgeon: Leanora Cover, MD;  Location: [facility];  Service: Orthopedics;  Laterality: Right;  . I&D EXTREMITY Left 08/22/2015   Procedure: IRRIGATION AND DEBRIDEMENT EXTREMITY;  Surgeon: Leanora Cover, MD;  Location: [facility];  Service: Orthopedics;  Laterality: Left;  I & d left thumb  . INCISION AND DRAINAGE Left 08/29/2015   Procedure: REPEAT INCISION AND DRAINAGE LEFT THUMB;  Surgeon: Leanora Cover, MD;  Location:  [facility];  Service: Orthopedics;  Laterality: Left;    reports that she has never smoked. She has never used smokeless tobacco. She reports current alcohol use of about 1.0 standard drinks of alcohol per week. She reports that she does not use drugs. family history includes Arthritis in her mother; Diabetes in her brother. Allergies  Allergen Reactions  . Levofloxacin Anaphylaxis  . Levofloxacin     REACTION: LEG PAIN/CRAMPS  . Tramadol Nausea Only   Current Outpatient Medications on File Prior to Visit  Medication Sig Dispense Refill  . celecoxib (CELEBREX) 200 MG capsule TAKE ONE CAPSULE BY MOUTH TWICE A DAY AS NEEDED 180 capsule 3  . cyclobenzaprine (FLEXERIL) 5 MG tablet Take 1 tablet (5 mg total) by mouth daily. **ANNUAL APPOINTMENT NEEDED FOR ADDITIONAL REFILLS** 90 tablet 1  . estradiol (ESTRACE) 1 MG tablet Take 1 mg by mouth daily.  6  . HYDROcodone-acetaminophen (NORCO) 5-325 MG tablet 1 tabs po q6 hours prn pain 30 tablet 0  . ondansetron (ZOFRAN) 4 MG tablet TAKE 1 TABLET BY MOUTH EVERY 8 HOURS AS NEEDED FOR NAUSEA AND VOMITING 18 tablet 3  . progesterone (PROMETRIUM) 100 MG capsule Take 100 mg by mouth daily.    . rosuvastatin (CRESTOR) 10 MG tablet Take 1 tablet (10 mg total) by mouth daily. 90 tablet 3  . Vitamin D, Ergocalciferol, (DRISDOL) 50000 units CAPS capsule TAKE 1 CAPSULE (50,000 UNITS TOTAL) BY MOUTH EVERY 7 DAYS. 12 capsule 0   No  current facility-administered medications on file prior to visit.    Review of Systems  Constitutional: Negative for other unusual diaphoresis or sweats HENT: Negative for ear discharge or swelling Eyes: Negative for other worsening visual disturbances Respiratory: Negative for stridor or other swelling  Gastrointestinal: Negative for worsening distension or other blood Genitourinary: Negative for retention or other urinary change Musculoskeletal: Negative for other MSK pain or swelling Skin: Negative for color  change or other new lesions Neurological: Negative for worsening tremors and other numbness  Psychiatric/Behavioral: Negative for worsening agitation or other fatigue All other system neg per pt    Objective:   Physical Exam BP 122/80   Pulse 98   Temp 97.9 F (36.6 C) (Oral)   Ht 5' 3.5" (1.613 m)   Wt 131 lb (59.4 kg)   LMP 06/23/2012   SpO2 99%   BMI 22.84 kg/m  VS noted, not ill appearing Constitutional: Pt appears in NAD HENT: Head: NCAT.  Right Ear: External ear normal.  Left Ear: External ear normal.  Eyes: . Pupils are equal, round, and reactive to light. Conjunctivae and EOM are normal Nose: without d/c or deformity Neck: Neck supple. Gross normal ROM Cardiovascular: Normal rate and regular rhythm.   Pulmonary/Chest: Effort normal and breath sounds without rales or wheezing.  Neurological: Pt is alert. At baseline orientation, motor grossly intact Skin: Skin is warm, no LE edema, has several small bruising near healed to bilat arms Distal left lateral leg with proximal area of what I suspect is a varicosity localized without other significant varicosities seen Psychiatric: Pt behavior is normal without agitation  No other exam findings Lab Results  Component Value Date   WBC 7.0 02/15/2018   HGB 13.0 02/15/2018   HCT 38.5 02/15/2018   PLT 299.0 02/15/2018   GLUCOSE 101 (H) 02/15/2018   CHOL 201 (H) 11/18/2016   TRIG 117.0 11/18/2016   HDL 82.90 11/18/2016   LDLDIRECT 108.8 08/22/2012   LDLCALC 95 11/18/2016   ALT 30 02/15/2018   AST 27 02/15/2018   NA 140 02/15/2018   K 3.9 02/15/2018   CL 106 02/15/2018   CREATININE 0.83 02/15/2018   BUN 11 02/15/2018   CO2 29 02/15/2018   TSH 1.86 11/18/2016         Assessment & Plan:

## 2018-12-28 NOTE — Assessment & Plan Note (Signed)
On new crestor and tolerating well, ok for lipids f/u with labs

## 2018-12-29 ENCOUNTER — Ambulatory Visit: Payer: 59

## 2018-12-29 ENCOUNTER — Ambulatory Visit
Admission: RE | Admit: 2018-12-29 | Discharge: 2018-12-29 | Disposition: A | Discharge status: Home / Self Care | Payer: 59 | Source: Ambulatory Visit | Attending: Obstetrics and Gynecology | Admitting: Obstetrics and Gynecology

## 2018-12-29 ENCOUNTER — Other Ambulatory Visit: Payer: Self-pay

## 2018-12-29 ENCOUNTER — Encounter: Payer: Self-pay | Admitting: Internal Medicine

## 2018-12-29 DIAGNOSIS — R928 Other abnormal and inconclusive findings on diagnostic imaging of breast: Secondary | ICD-10-CM

## 2018-12-29 LAB — URINALYSIS, ROUTINE W REFLEX MICROSCOPIC
Bilirubin Urine: NEGATIVE
Hgb urine dipstick: NEGATIVE
Ketones, ur: NEGATIVE
Leukocytes,Ua: NEGATIVE
Nitrite: NEGATIVE
RBC / HPF: NONE SEEN (ref 0–?)
Specific Gravity, Urine: 1.01 (ref 1.000–1.030)
Total Protein, Urine: NEGATIVE
Urine Glucose: NEGATIVE
Urobilinogen, UA: 0.2 (ref 0.0–1.0)
pH: 7 (ref 5.0–8.0)

## 2019-01-20 ENCOUNTER — Telehealth: Payer: Self-pay | Admitting: Internal Medicine

## 2019-01-20 NOTE — Telephone Encounter (Signed)
Pt requesting refill for HYDROcodone-acetaminophen (NORCO) 5-325 MG tablet Is unable to see vein specialist until October. Please advise. [id number] [address], [place]. [phone] (Phone) [phone] (Fax)

## 2019-01-23 MED ORDER — HYDROCODONE-ACETAMINOPHEN 5-325 MG PO TABS: ORAL_TABLET | ORAL | 0 refills | Status: DC

## 2019-01-23 NOTE — Addendum Note (Signed)
Addended by: Biagio Borg on: 01/23/2019 12:52 PM   Modules accepted: Orders

## 2019-01-23 NOTE — Telephone Encounter (Signed)
Done erx 

## 2019-02-02 ENCOUNTER — Other Ambulatory Visit: Payer: Self-pay

## 2019-02-02 DIAGNOSIS — I83812 Varicose veins of left lower extremities with pain: Secondary | ICD-10-CM

## 2019-02-10 ENCOUNTER — Ambulatory Visit (HOSPITAL_COMMUNITY)
Admission: RE | Admit: 2019-02-10 | Discharge: 2019-02-10 | Disposition: A | Discharge status: Home / Self Care | Payer: 59 | Source: Ambulatory Visit | Attending: Vascular Surgery | Admitting: Vascular Surgery

## 2019-02-10 ENCOUNTER — Other Ambulatory Visit: Payer: Self-pay

## 2019-02-10 ENCOUNTER — Ambulatory Visit (INDEPENDENT_AMBULATORY_CARE_PROVIDER_SITE_OTHER): Payer: 59 | Admitting: Physician Assistant

## 2019-02-10 VITALS — BP 125/74 | HR 60 | Resp 14 | Ht 63.0 in | Wt 131.0 lb

## 2019-02-10 DIAGNOSIS — I83812 Varicose veins of left lower extremities with pain: Secondary | ICD-10-CM | POA: Insufficient documentation

## 2019-02-10 DIAGNOSIS — M545 Low back pain: Secondary | ICD-10-CM | POA: Diagnosis not present

## 2019-02-10 DIAGNOSIS — R252 Cramp and spasm: Secondary | ICD-10-CM

## 2019-02-10 DIAGNOSIS — G8929 Other chronic pain: Secondary | ICD-10-CM

## 2019-02-10 NOTE — Progress Notes (Signed)
Requested by:  Corwin Levins, MD [address] [place],  [place] [address]  Reason for consultation: pain left lateral leg    History of Present Illness   Angela Webb is a 47 y.o. (1970/11/07) female who presents with chief complaint of worsening left lateral lower leg pain with concern for varicose veins.  Patient describes a 6 to 22-month period of worsening pain in her left lateral lower leg.  She has no visible varicosities, no history of venous ulcers, no history of DVT, and no history of trauma or vascular surgery procedures.  Patient states pain is worse after a bike ride or after being on her feet throughout the day.  She was diagnosed with severe degenerative disc disease about 8 years ago however has not been back to a orthopedic or spinal specialist since then.  Now that she is thinking about it she states that she has been on gabapentin which was discontinued about 10 to 12 months ago and believes this may have masked her current symptoms.  She denies history of claudication, rest pain [place] or active tissue ischemia of bilateral lower extremities.  She denies tobacco use.  She is on a statin daily.  It should also be noted that patient finds relief when she wears a compression sleeve on her left lower extremity.  She denies any edema of left lower extremity.  Past Medical History:  Diagnosis Date  . Allergic rhinitis   . ALLERGIC RHINITIS 10/03/2007  . Complication of anesthesia   . GERD (gastroesophageal reflux disease)   . Hyperlipidemia   . HYPERLIPIDEMIA 08/08/2008  . Medical history non-contributory   . Neuromuscular disorder (HCC)    DDD  . PONV (postoperative nausea and vomiting)     Past Surgical History:  Procedure Laterality Date  . EXCISION MASS UPPER EXTREMETIES Right 01/21/2017   Procedure: RIGHT THUMB EXCISION CYST AND DEBRIEDMENT DISTAL INTERPHALANGEAL JOINT;  Surgeon: Betha Loa, MD;  Location: [facility];  Service: Orthopedics;   Laterality: Right;  . I&D EXTREMITY Left 08/22/2015   Procedure: IRRIGATION AND DEBRIDEMENT EXTREMITY;  Surgeon: Betha Loa, MD;  Location: [facility];  Service: Orthopedics;  Laterality: Left;  I & d left thumb  . INCISION AND DRAINAGE Left 08/29/2015   Procedure: REPEAT INCISION AND DRAINAGE LEFT THUMB;  Surgeon: Betha Loa, MD;  Location:  SURGERY CENTER;  Service: Orthopedics;  Laterality: Left;    Social History   Socioeconomic History  . Marital status: Married    Spouse name: Not on file  . Number of children: 1  . Years of education: Not on file  . Highest education level: Not on file  Occupational History  . Occupation: Nutritional therapist: [employer]  Social Needs  . Financial resource strain: Not on file  . Food insecurity    Worry: Not on file    Inability: Not on file  . Transportation needs    Medical: Not on file    Non-medical: Not on file  Tobacco Use  . Smoking status: Never Smoker  . Smokeless tobacco: Never Used  Substance and Sexual Activity  . Alcohol use: Yes    Alcohol/week: 1.0 standard drinks    Types: 1 Glasses of wine per week    Comment: social  . Drug use: No  . Sexual activity: Yes  Lifestyle  . Physical activity    Days per week: Not on file    Minutes  per session: Not on file  . Stress: Not on file  Relationships  . Social Herbalist on phone: Not on file    Gets together: Not on file    Attends religious service: Not on file    Active member of club or organization: Not on file    Attends meetings of clubs or organizations: Not on file    Relationship status: Not on file  . Intimate partner violence    Fear of current or ex partner: Not on file    Emotionally abused: Not on file    Physically abused: Not on file    Forced sexual activity: Not on file  Other Topics Concern  . Not on file  Social History Narrative   ** Merged History Encounter **        Family History   Problem Relation Age of Onset  . Arthritis Mother   . Diabetes Brother        type I, deceased    Current Outpatient Medications  Medication Sig Dispense Refill  . celecoxib (CELEBREX) 200 MG capsule TAKE ONE CAPSULE BY MOUTH TWICE A DAY AS NEEDED 180 capsule 3  . cyclobenzaprine (FLEXERIL) 5 MG tablet Take 1 tablet (5 mg total) by mouth daily. **ANNUAL APPOINTMENT NEEDED FOR ADDITIONAL REFILLS** 90 tablet 1  . estradiol (ESTRACE) 1 MG tablet Take 1 mg by mouth daily.  6  . HYDROcodone-acetaminophen (NORCO) 5-325 MG tablet 1 tabs po q6 hours prn pain 30 tablet 0  . ondansetron (ZOFRAN) 4 MG tablet TAKE 1 TABLET BY MOUTH EVERY 8 HOURS AS NEEDED FOR NAUSEA AND VOMITING 18 tablet 3  . progesterone (PROMETRIUM) 100 MG capsule Take 100 mg by mouth daily.    . rosuvastatin (CRESTOR) 10 MG tablet Take 1 tablet (10 mg total) by mouth daily. 90 tablet 3  . Vitamin D, Ergocalciferol, (DRISDOL) 50000 units CAPS capsule TAKE 1 CAPSULE (50,000 UNITS TOTAL) BY MOUTH EVERY 7 DAYS. 12 capsule 0   No current facility-administered medications for this visit.     Allergies  Allergen Reactions  . Levofloxacin Anaphylaxis  . Levofloxacin     REACTION: LEG PAIN/CRAMPS  . Tramadol Nausea Only    REVIEW OF SYSTEMS (negative unless checked):   Cardiac:  []  Chest pain or chest pressure? []  Shortness of breath upon activity? []  Shortness of breath when lying flat? []  Irregular heart rhythm?  Vascular:  []  Pain in calf, thigh, or hip brought on by walking? []  Pain in feet at night that wakes you up from your sleep? []  Blood clot in your veins? []  Leg swelling?  Pulmonary:  []  Oxygen at home? []  Productive cough? []  Wheezing?  Neurologic:  []  Sudden weakness in arms or legs? []  Sudden numbness in arms or legs? []  Sudden onset of difficult speaking or slurred speech? []  Temporary loss of vision in one eye? []  Problems with dizziness?  Gastrointestinal:  []  Blood in stool? []  Vomited blood?   Genitourinary:  []  Burning when urinating? []  Blood in urine?  Psychiatric:  []  Major depression  Hematologic:  []  Bleeding problems? []  Problems with blood clotting?  Dermatologic:  []  Rashes or ulcers?  Constitutional:  []  Fever or chills?  Ear/Nose/Throat:  []  Change in hearing? []  Nose bleeds? []  Sore throat?  Musculoskeletal:  [x]  Back pain? []  Joint pain? [x]  Muscle pain?   Physical Examination     Vitals:   02/10/19 1335  BP: 125/74  Pulse: 60  Resp: 14  SpO2: 100%  Weight: 131 lb (59.4 kg)  Height: 5\' 3"  (1.6 m)   Body mass index is 23.21 kg/m.  General Alert, O x 3, WD, NAD  Head [place]/AT,    Neck Supple, mid-line trachea,    Pulmonary Sym exp, good B air movt, CTA B  Cardiac RRR, Nl S1, S2  Vascular Vessel Right Left  Radial Palpable Palpable  PT Faintly palpable Faintly palpable  DP Palpable Faintly palpable    Gastro- intestinal soft, non-distended, non-tender to palpation,   Musculo- skeletal M/S 5/5 throughout  , Extremities without ischemic changes  , No edema present, No visible varicosities , No Lipodermatosclerosis present  Neurologic Cranial nerves 2-12 intact , Pain and light touch intact in extremities , Motor exam as listed above  Psychiatric Judgement intact, Mood & affect appropriate for pt's clinical situation  Dermatologic See M/S exam for extremity exam, No rashes otherwise noted  Lymphatic  Palpable lymph nodes: None    Non-invasive Vascular Imaging   BLE Venous Insufficiency Duplex    RLE:   Negative for DVT and SVT,   Negative for GSV reflux,   Negative for SSV reflux,  Negative for deep venous reflux  LLE:  Negative for DVT and SVT,   Negative for GSV reflux,   Negative for SSV reflux,  Negative for deep venous reflux   Medical Decision Making   Angela Webb is a 48 y.o. female who presents with left leg pain with concern for varicose veins   No visible varicose veins on exam  Reflux study  negative for DVT, deep venous reflux, or superficial venous reflux  Given history of degenerative disc disease and discontinuation of gabapentin around the time of symptom onset I suspect this is may be related to her lower back  I will refer patient back to her PCP for further work-up  This patient will follow-up on an as-needed basis   Emilie RutterMatthew Cason Dabney PA-C [facility] Office: [phone]  02/10/2019, 2:12 PM  Clinic MD: Dr. Randie Heinzain

## 2019-06-30 ENCOUNTER — Other Ambulatory Visit: Payer: Self-pay

## 2019-06-30 ENCOUNTER — Encounter: Payer: Self-pay | Admitting: Internal Medicine

## 2019-06-30 ENCOUNTER — Ambulatory Visit (INDEPENDENT_AMBULATORY_CARE_PROVIDER_SITE_OTHER): Payer: 59 | Admitting: Internal Medicine

## 2019-06-30 VITALS — BP 116/72 | HR 63 | Temp 98.0°F | Ht 63.0 in | Wt 133.5 lb

## 2019-06-30 DIAGNOSIS — M545 Low back pain, unspecified: Secondary | ICD-10-CM

## 2019-06-30 DIAGNOSIS — G8929 Other chronic pain: Secondary | ICD-10-CM

## 2019-06-30 DIAGNOSIS — E785 Hyperlipidemia, unspecified: Secondary | ICD-10-CM

## 2019-06-30 DIAGNOSIS — Z0000 Encounter for general adult medical examination without abnormal findings: Secondary | ICD-10-CM

## 2019-06-30 DIAGNOSIS — E559 Vitamin D deficiency, unspecified: Secondary | ICD-10-CM | POA: Diagnosis not present

## 2019-06-30 DIAGNOSIS — N946 Dysmenorrhea, unspecified: Secondary | ICD-10-CM | POA: Diagnosis not present

## 2019-06-30 DIAGNOSIS — K219 Gastro-esophageal reflux disease without esophagitis: Secondary | ICD-10-CM | POA: Diagnosis not present

## 2019-06-30 DIAGNOSIS — J309 Allergic rhinitis, unspecified: Secondary | ICD-10-CM

## 2019-06-30 MED ORDER — HYDROCODONE-ACETAMINOPHEN 5-325 MG PO TABS: ORAL_TABLET | ORAL | 0 refills | Status: DC

## 2019-06-30 NOTE — Progress Notes (Signed)
**Note Angela-Identified via Obfuscation** Subjective:    Patient ID: Angela Webb, female    DOB: 1970/05/11, 49 y.o.   MRN: [id number]  HPI  Here to f/u; overall doing ok,  Pt denies chest pain, increasing sob or doe, wheezing, orthopnea, PND, increased LE swelling, palpitations, dizziness or syncope.  Pt denies new neurological symptoms such as new headache, or facial or extremity weakness or numbness.  Pt denies polydipsia, polyuria, or low sugar episode.  Pt states overall good compliance with meds, mostly trying to follow appropriate diet, Wt Readings from Last 3 Encounters:  06/30/19 133 lb 8 oz (60.6 kg)  02/10/19 131 lb (59.4 kg)  12/28/18 131 lb (59.4 kg)  Pt continues to have recurring LBP without change in severity, bowel or bladder change, fever, wt loss,  worsening LE pain/numbness/weakness, gait change or falls. Denies worsening reflux, abd pain, dysphagia, n/v, bowel change or blood.  Does have several wks ongoing nasal allergy symptoms with clearish congestion, itch and sneezing, without fever, pain, ST, cough, swelling or wheezing. Past Medical History:  Diagnosis Date  . Allergic rhinitis   . ALLERGIC RHINITIS 10/03/2007  . Complication of anesthesia   . GERD (gastroesophageal reflux disease)   . Hyperlipidemia   . HYPERLIPIDEMIA 08/08/2008  . Medical history non-contributory   . Neuromuscular disorder (HCC)    DDD  . PONV (postoperative nausea and vomiting)    Past Surgical History:  Procedure Laterality Date  . EXCISION MASS UPPER EXTREMETIES Right 01/21/2017   Procedure: RIGHT THUMB EXCISION CYST AND DEBRIEDMENT DISTAL INTERPHALANGEAL JOINT;  Surgeon: Betha Loa, MD;  Location: [facility];  Service: Orthopedics;  Laterality: Right;  . I & D EXTREMITY Left 08/22/2015   Procedure: IRRIGATION AND DEBRIDEMENT EXTREMITY;  Surgeon: Betha Loa, MD;  Location: [facility];  Service: Orthopedics;  Laterality: Left;  I & d left thumb  . INCISION AND DRAINAGE Left 08/29/2015   Procedure:  REPEAT INCISION AND DRAINAGE LEFT THUMB;  Surgeon: Betha Loa, MD;  Location: [facility];  Service: Orthopedics;  Laterality: Left;    reports that she has never smoked. She has never used smokeless tobacco. She reports current alcohol use of about 1.0 standard drinks of alcohol per week. She reports that she does not use drugs. family history includes Arthritis in her mother; Diabetes in her brother. Allergies  Allergen Reactions  . Levofloxacin Anaphylaxis  . Levofloxacin     REACTION: LEG PAIN/CRAMPS  . Tramadol Nausea Only   Current Outpatient Medications on File Prior to Visit  Medication Sig Dispense Refill  . celecoxib (CELEBREX) 200 MG capsule TAKE ONE CAPSULE BY MOUTH TWICE A DAY AS NEEDED 180 capsule 3  . cyclobenzaprine (FLEXERIL) 5 MG tablet Take 1 tablet (5 mg total) by mouth daily. **ANNUAL APPOINTMENT NEEDED FOR ADDITIONAL REFILLS** 90 tablet 1  . estradiol (ESTRACE) 1 MG tablet Take 1 mg by mouth daily.  6  . ibuprofen (ADVIL) 800 MG tablet ibuprofen 800 mg tablet  TAKE 1 TABLET 3 TIMES A DAY OR AS NEEDED FOR DISCOMFORT    . ondansetron (ZOFRAN) 4 MG tablet TAKE 1 TABLET BY MOUTH EVERY 8 HOURS AS NEEDED FOR NAUSEA AND VOMITING 18 tablet 3  . pantoprazole (PROTONIX) 40 MG tablet Protonix 40 mg tablet,delayed release  Take 1 tablet every day by oral route.    . progesterone (PROMETRIUM) 100 MG capsule Take 100 mg by mouth daily.    . rosuvastatin (CRESTOR) 10 MG tablet Take 1 tablet (10 mg  total) by mouth daily. 90 tablet 3  . simvastatin (ZOCOR) 40 MG tablet Zocor 40 mg tablet  Take 1 tablet every day by oral route.    . triamcinolone (NASACORT ALLERGY 24HR) 55 MCG/ACT AERO nasal inhaler Nasacort    . Vitamin D, Ergocalciferol, (DRISDOL) 50000 units CAPS capsule TAKE 1 CAPSULE (50,000 UNITS TOTAL) BY MOUTH EVERY 7 DAYS. 12 capsule 0   No current facility-administered medications on file prior to visit.   Review of Systems All otherwise neg per pt       Objective:   Physical Exam BP 116/72 (BP Location: Left Arm, Patient Position: Sitting, Cuff Size: Normal)   Pulse 63   Temp 98 F (36.7 C) (Oral)   Ht 5\' 3"  (1.6 m)   Wt 133 lb 8 oz (60.6 kg)   LMP 06/23/2012   SpO2 99%   BMI 23.65 kg/m  VS noted,  Constitutional: Pt appears in NAD HENT: Head: NCAT.  Right Ear: External ear normal.  Left Ear: External ear normal.  Eyes: . Pupils are equal, round, and reactive to light. Conjunctivae and EOM are normal Nose: without d/c or deformity Neck: Neck supple. Gross normal ROM Cardiovascular: Normal rate and regular rhythm.   Pulmonary/Chest: Effort normal and breath sounds without rales or wheezing.  Abd:  Soft, NT, ND, + BS, no organomegaly Neurological: Pt is alert. At baseline orientation, motor grossly intact Skin: Skin is warm. No rashes, other new lesions, no LE edema Psychiatric: Pt behavior is normal without agitation  All otherwise neg per pt Lab Results  Component Value Date   WBC 6.2 12/28/2018   HGB 12.6 12/28/2018   HCT 37.8 12/28/2018   PLT 298.0 12/28/2018   GLUCOSE 94 12/28/2018   CHOL 195 12/28/2018   TRIG 157.0 (H) 12/28/2018   HDL 79.90 12/28/2018   LDLDIRECT 108.8 08/22/2012   LDLCALC 84 12/28/2018   ALT 13 12/28/2018   AST 16 12/28/2018   NA 138 12/28/2018   K 3.8 12/28/2018   CL 102 12/28/2018   CREATININE 0.88 12/28/2018   BUN 12 12/28/2018   CO2 28 12/28/2018   TSH 0.96 12/28/2018   INR 1.1 (H) 12/28/2018      Assessment & Plan:

## 2019-06-30 NOTE — Patient Instructions (Signed)
Please continue all other medications as before, and refills have been done if requested - the hydrocodone  Please have the pharmacy call with any other refills you may need.  Please continue your efforts at being more active, low cholesterol diet, and weight control  Please keep your appointments with your specialists as you may have planned  Please call if you change your mind about the colonoscopy  Please make an Appointment to return in 6 months, or sooner if needed, also with Lab Appointment for testing done 3-5 days before at the FIRST FLOOR Lab (so this is for TWO appointments - please see the scheduling desk as you leave)

## 2019-07-02 ENCOUNTER — Encounter: Payer: Self-pay | Admitting: Internal Medicine

## 2019-07-02 NOTE — Assessment & Plan Note (Signed)
Chronic, for pain med prn,  to f/u any worsening symptoms or concerns

## 2019-07-02 NOTE — Assessment & Plan Note (Signed)
stable overall by history and exam, recent data reviewed with pt, and pt to continue medical treatment as before,  to f/u any worsening symptoms or concerns  

## 2019-07-02 NOTE — Assessment & Plan Note (Signed)
Ok to restart the nasacort asd,  to f/u any worsening symptoms or concerns

## 2019-07-02 NOTE — Assessment & Plan Note (Addendum)
stable overall by history and exam, recent data reviewed with pt, and pt to continue medical treatment as before,  to f/u any worsening symptoms or concerns  I spent in preparing to see the patient by review of recent labs, imaging and procedures, obtaining and reviewing separately obt ained history, communicating with the patient and family or caregiver, ordering medications, tests or procedures, and documenting clinical information in the EHR including the differential Dx, treatment, and any further evaluation and other management of HLD, GERD, allergies, and chronic pain

## 2019-07-18 ENCOUNTER — Other Ambulatory Visit: Payer: Self-pay | Admitting: Internal Medicine

## 2019-07-22 ENCOUNTER — Other Ambulatory Visit: Payer: Self-pay | Admitting: Internal Medicine

## 2019-07-23 NOTE — Telephone Encounter (Signed)
Please refill as per office routine med refill policy (all routine meds refilled for 3 mo or monthly per pt preference up to one year from last visit, then month to month grace period for 3 mo, then further med refills will have to be denied)  

## 2019-11-02 ENCOUNTER — Other Ambulatory Visit: Payer: Self-pay | Admitting: Internal Medicine

## 2019-11-02 MED ORDER — HYDROCODONE-ACETAMINOPHEN 5-325 MG PO TABS: ORAL_TABLET | ORAL | 0 refills | Status: DC

## 2019-11-02 NOTE — Telephone Encounter (Signed)
Done erx 

## 2019-11-02 NOTE — Telephone Encounter (Signed)
New Message:   1.Medication Requested: HYDROcodone-acetaminophen (NORCO) 5-325 MG tablet 2. Pharmacy (Name, Street, [place]): [id number] - [place], [place] - [address] 3. On Med List: yes  4. Last Visit with PCP: 06/30/19  5. Next visit date with PCP: 12/29/19   Agent: Please be advised that RX refills may take up to 3 business days. We ask that you follow-up with your pharmacy.

## 2019-11-15 ENCOUNTER — Other Ambulatory Visit: Payer: Self-pay | Admitting: Internal Medicine

## 2019-11-22 ENCOUNTER — Telehealth: Payer: No Typology Code available for payment source | Admitting: Internal Medicine

## 2019-11-22 ENCOUNTER — Encounter: Payer: Self-pay | Admitting: Internal Medicine

## 2019-11-22 NOTE — Patient Instructions (Signed)
none

## 2019-11-22 NOTE — Progress Notes (Signed)
**Note Angela-Identified via Obfuscation** Patient ID: Angela Webb, female   DOB: August 03, 1970, 49 y.o.   MRN: [id number]  Pt not seen today after multiple attempts at video and phone

## 2019-12-29 ENCOUNTER — Ambulatory Visit: Payer: 59 | Admitting: Internal Medicine

## 2020-01-09 ENCOUNTER — Encounter: Payer: Self-pay | Admitting: Internal Medicine

## 2020-01-09 ENCOUNTER — Ambulatory Visit (INDEPENDENT_AMBULATORY_CARE_PROVIDER_SITE_OTHER): Payer: No Typology Code available for payment source | Admitting: Internal Medicine

## 2020-01-09 ENCOUNTER — Other Ambulatory Visit (INDEPENDENT_AMBULATORY_CARE_PROVIDER_SITE_OTHER): Payer: No Typology Code available for payment source

## 2020-01-09 ENCOUNTER — Other Ambulatory Visit: Payer: Self-pay

## 2020-01-09 VITALS — BP 120/76 | HR 77 | Temp 98.1°F | Ht 63.0 in | Wt 134.0 lb

## 2020-01-09 DIAGNOSIS — M545 Low back pain: Secondary | ICD-10-CM | POA: Diagnosis not present

## 2020-01-09 DIAGNOSIS — R040 Epistaxis: Secondary | ICD-10-CM | POA: Diagnosis not present

## 2020-01-09 DIAGNOSIS — M5412 Radiculopathy, cervical region: Secondary | ICD-10-CM

## 2020-01-09 DIAGNOSIS — H60502 Unspecified acute noninfective otitis externa, left ear: Secondary | ICD-10-CM | POA: Diagnosis not present

## 2020-01-09 DIAGNOSIS — J309 Allergic rhinitis, unspecified: Secondary | ICD-10-CM

## 2020-01-09 DIAGNOSIS — H6092 Unspecified otitis externa, left ear: Secondary | ICD-10-CM | POA: Insufficient documentation

## 2020-01-09 DIAGNOSIS — J069 Acute upper respiratory infection, unspecified: Secondary | ICD-10-CM

## 2020-01-09 DIAGNOSIS — G8929 Other chronic pain: Secondary | ICD-10-CM

## 2020-01-09 LAB — CBC WITH DIFFERENTIAL/PLATELET
Basophils Absolute: 0 10*3/uL (ref 0.0–0.1)
Basophils Relative: 0.5 % (ref 0.0–3.0)
Eosinophils Absolute: 0.1 10*3/uL (ref 0.0–0.7)
Eosinophils Relative: 0.8 % (ref 0.0–5.0)
HCT: 37.1 % (ref 36.0–46.0)
Hemoglobin: 12.5 g/dL (ref 12.0–15.0)
Lymphocytes Relative: 41.2 % (ref 12.0–46.0)
Lymphs Abs: 2.7 10*3/uL (ref 0.7–4.0)
MCHC: 33.6 g/dL (ref 30.0–36.0)
MCV: 93.8 fl (ref 78.0–100.0)
Monocytes Absolute: 0.5 10*3/uL (ref 0.1–1.0)
Monocytes Relative: 7.6 % (ref 3.0–12.0)
Neutro Abs: 3.3 10*3/uL (ref 1.4–7.7)
Neutrophils Relative %: 49.9 % (ref 43.0–77.0)
Platelets: 284 10*3/uL (ref 150.0–400.0)
RBC: 3.95 Mil/uL (ref 3.87–5.11)
RDW: 12.6 % (ref 11.5–15.5)
WBC: 6.5 10*3/uL (ref 4.0–10.5)

## 2020-01-09 MED ORDER — LEVOFLOXACIN 500 MG PO TABS: 500.0000 mg | ORAL_TABLET | Freq: Every day | ORAL | 0 refills | Status: DC

## 2020-01-09 MED ORDER — PREDNISONE 10 MG PO TABS: ORAL_TABLET | ORAL | 0 refills | Status: DC

## 2020-01-09 NOTE — Progress Notes (Signed)
**Note Angela-Identified via Obfuscation** Subjective:    Patient ID: Angela Webb, female    DOB: 1970-07-09, 49 y.o.   MRN: [id number]  HPI  Here with left ear pain, swelling mild to mod for 3 days without d/c, vertigo, fever or drainage.  Also with almost daily nosebleed from the right side and Does have several wks ongoing nasal allergy symptoms with clearish congestion, itch and sneezing, without fever, pain, ST, cough, swelling or wheezing.  Also had a urI a few months ago, and wondering if that was covid infection.  Also c/o right neck pain with intermittent pain weakness numbness to the distal RUE., mild to mod x 3-4 wks.  Pt continues to have recurring LBP without change in severity, bowel or bladder change, fever, wt loss,  worsening LE pain/numbness/weakness, gait change or falls. Past Medical History:  Diagnosis Date  . Allergic rhinitis   . ALLERGIC RHINITIS 10/03/2007  . Complication of anesthesia   . GERD (gastroesophageal reflux disease)   . Hyperlipidemia   . HYPERLIPIDEMIA 08/08/2008  . Medical history non-contributory   . Neuromuscular disorder (HCC)    DDD  . PONV (postoperative nausea and vomiting)    Past Surgical History:  Procedure Laterality Date  . EXCISION MASS UPPER EXTREMETIES Right 01/21/2017   Procedure: RIGHT THUMB EXCISION CYST AND DEBRIEDMENT DISTAL INTERPHALANGEAL JOINT;  Surgeon: Betha Loa, MD;  Location: [facility];  Service: Orthopedics;  Laterality: Right;  . I & D EXTREMITY Left 08/22/2015   Procedure: IRRIGATION AND DEBRIDEMENT EXTREMITY;  Surgeon: Betha Loa, MD;  Location: [facility];  Service: Orthopedics;  Laterality: Left;  I & d left thumb  . INCISION AND DRAINAGE Left 08/29/2015   Procedure: REPEAT INCISION AND DRAINAGE LEFT THUMB;  Surgeon: Betha Loa, MD;  Location: [facility];  Service: Orthopedics;  Laterality: Left;    reports that she has never smoked. She has never used smokeless tobacco. She reports current alcohol use of about  1.0 standard drink of alcohol per week. She reports that she does not use drugs. family history includes Arthritis in her mother; Diabetes in her brother. Allergies  Allergen Reactions  . Levofloxacin Anaphylaxis  . Levofloxacin     REACTION: LEG PAIN/CRAMPS  . Tramadol Nausea Only   Current Outpatient Medications on File Prior to Visit  Medication Sig Dispense Refill  . celecoxib (CELEBREX) 200 MG capsule TAKE ONE CAPSULE BY MOUTH TWICE A DAY AS NEEDED 180 capsule 3  . cyclobenzaprine (FLEXERIL) 5 MG tablet TAKE 1 TABLET (5 MG TOTAL) BY MOUTH DAILY. **ANNUAL APPOINTMENT NEEDED FOR ADDITIONAL REFILLS** 30 tablet 5  . estradiol (ESTRACE) 1 MG tablet Take 1 mg by mouth daily.  6  . ibuprofen (ADVIL) 800 MG tablet ibuprofen 800 mg tablet  TAKE 1 TABLET 3 TIMES A DAY OR AS NEEDED FOR DISCOMFORT    . ondansetron (ZOFRAN) 4 MG tablet TAKE 1 TABLET BY MOUTH EVERY 8 HOURS AS NEEDED FOR NAUSEA AND VOMITING 18 tablet 1  . pantoprazole (PROTONIX) 40 MG tablet Protonix 40 mg tablet,delayed release  Take 1 tablet every day by oral route.    . progesterone (PROMETRIUM) 100 MG capsule Take 100 mg by mouth daily.    . progesterone (PROMETRIUM) 100 MG capsule Take 100 mg by mouth at bedtime.    . rosuvastatin (CRESTOR) 10 MG tablet TAKE 1 TABLET BY MOUTH EVERY DAY 30 tablet 11  . simvastatin (ZOCOR) 40 MG tablet Zocor 40 mg tablet  Take 1 tablet every  day by oral route.    . triamcinolone (NASACORT ALLERGY 24HR) 55 MCG/ACT AERO nasal inhaler Nasacort     No current facility-administered medications on file prior to visit.   Review of Systems All otherwise neg per pt    Objective:   Physical Exam BP 120/76 (BP Location: Left Arm, Patient Position: Sitting, Cuff Size: Large)   Pulse 77   Temp 98.1 F (36.7 C) (Oral)   Ht 5\' 3"  (1.6 m)   Wt 134 lb (60.8 kg)   LMP 06/23/2012   SpO2 95%   BMI 23.74 kg/m  VS noted,  Constitutional: Pt appears in NAD HENT: Head: NCAT.  Right Ear: External ear  normal.  Left Ear: External ear normal.  Eyes: . Pupils are equal, round, and reactive to light. Conjunctivae and EOM are normal Nose: without d/c or deformity Neck: Neck supple. Gross normal ROM Cardiovascular: Normal rate and regular rhythm.   Pulmonary/Chest: Effort normal and breath sounds without rales or wheezing.  Abd:  Soft, NT, ND, + BS, no organomegaly Neurological: Pt is alert. At baseline orientation, motor grossly intact Skin: Skin is warm. No rashes, other new lesions, no LE edema Psychiatric: Pt behavior is normal without agitation  All otherwise neg per pt Lab Results  Component Value Date   WBC 6.5 01/09/2020   HGB 12.5 01/09/2020   HCT 37.1 01/09/2020   PLT 284.0 01/09/2020   GLUCOSE 94 12/28/2018   CHOL 195 12/28/2018   TRIG 157.0 (H) 12/28/2018   HDL 79.90 12/28/2018   LDLDIRECT 108.8 08/22/2012   LDLCALC 84 12/28/2018   ALT 13 12/28/2018   AST 16 12/28/2018   NA 138 12/28/2018   K 3.8 12/28/2018   CL 102 12/28/2018   CREATININE 0.88 12/28/2018   BUN 12 12/28/2018   CO2 28 12/28/2018   TSH 0.96 12/28/2018   INR 1.1 (H) 12/28/2018      Assessment & Plan:

## 2020-01-09 NOTE — Patient Instructions (Addendum)
Please take all new medication as prescribed - the levaquin for the left ear and sinus  Please take all new medication as prescribed  - the prednisone for the sinus allergies and right neck  Please continue all other medications as before, and refills have been done if requested.  Please have the pharmacy call with any other refills you may need.  Please keep your appointments with your specialists as you may have planned  You will be contacted regarding the referral for: ENT  Please go to the LAB at the blood drawing area for the tests to be done - the cbc and covid antibody test  You will be contacted by phone if any changes need to be made immediately.  Otherwise, you will receive a letter about your results with an explanation, but please check with MyChart first.  Please remember to sign up for MyChart if you have not done so, as this will be important to you in the future with finding out test results, communicating by private email, and scheduling acute appointments online when needed.

## 2020-01-10 ENCOUNTER — Encounter: Payer: Self-pay | Admitting: Internal Medicine

## 2020-01-10 ENCOUNTER — Other Ambulatory Visit: Payer: Self-pay | Admitting: Internal Medicine

## 2020-01-10 LAB — SARS-COV-2 IGG: SARS-COV-2 IgG: 0.03

## 2020-01-10 MED ORDER — HYDROCODONE-ACETAMINOPHEN 5-325 MG PO TABS: ORAL_TABLET | ORAL | 0 refills | Status: DC

## 2020-01-10 NOTE — Telephone Encounter (Signed)
Done erx 

## 2020-01-14 ENCOUNTER — Encounter: Payer: Self-pay | Admitting: Internal Medicine

## 2020-01-14 NOTE — Assessment & Plan Note (Signed)
/  Mild to mod, for predpac asd,  to f/u any worsening symptoms or concerns 

## 2020-01-14 NOTE — Assessment & Plan Note (Addendum)
For ent referral, cbc and covid serology

## 2020-01-14 NOTE — Assessment & Plan Note (Signed)
Mild to mod, for antibx course,  to f/u any worsening symptoms or concerns 

## 2020-01-14 NOTE — Assessment & Plan Note (Signed)
stable overall by history and exam, recent data reviewed with pt, and pt to continue medical treatment as before,  to f/u any worsening symptoms or concerns  

## 2020-01-14 NOTE — Assessment & Plan Note (Addendum)
Mild to mod, for predpac asd,  to f/u any worsening symptoms or concerns  I spent 31 minutes in preparing to see the patient by review of recent labs, imaging and procedures, obtaining and reviewing separately obtained history, communicating with the patient and family or caregiver, ordering medications, tests or procedures, and documenting clinical information in the EHR including the differential Dx, treatment, and any further evaluation and other management of right cervical radiculopathy, left otitis externa, allergies, nosebleed, lbp

## 2020-01-18 ENCOUNTER — Telehealth: Payer: Self-pay | Admitting: Internal Medicine

## 2020-01-18 MED ORDER — AMOXICILLIN-POT CLAVULANATE 875-125 MG PO TABS: 1.0000 | ORAL_TABLET | Freq: Two times a day (BID) | ORAL | 0 refills | Status: DC

## 2020-01-18 NOTE — Telephone Encounter (Signed)
This would be a refill of the same rx started sept 7  This is usually not needed for her sinus problem  Should be ok to finish what she has, and let us know next wk if getting worse such as pain or fever

## 2020-01-18 NOTE — Telephone Encounter (Signed)
Ok for change levaquin to augmentin - done erx

## 2020-01-18 NOTE — Telephone Encounter (Signed)
   Pt c/o medication issue:  1. Name of Medication: levofloxacin (LEVAQUIN) 500 MG tablet  2. How are you currently taking this medication (dosage and times per day)? Patient stopped taking  3. Are you having a reaction (difficulty breathing--STAT)? no  4. What is your medication issue? Patient states medication caused her to have leg pain

## 2020-01-19 NOTE — Telephone Encounter (Signed)
Message left for patient

## 2020-02-09 ENCOUNTER — Telehealth: Payer: Self-pay | Admitting: Internal Medicine

## 2020-02-09 ENCOUNTER — Encounter (HOSPITAL_COMMUNITY): Payer: Self-pay | Admitting: Emergency Medicine

## 2020-02-09 ENCOUNTER — Emergency Department (HOSPITAL_COMMUNITY): Payer: No Typology Code available for payment source

## 2020-02-09 ENCOUNTER — Emergency Department (HOSPITAL_COMMUNITY)
Admission: EM | Admit: 2020-02-09 | Discharge: 2020-02-09 | Disposition: A | Discharge status: Home / Self Care | Payer: No Typology Code available for payment source | Attending: Emergency Medicine | Admitting: Emergency Medicine

## 2020-02-09 ENCOUNTER — Other Ambulatory Visit: Payer: Self-pay

## 2020-02-09 DIAGNOSIS — R202 Paresthesia of skin: Secondary | ICD-10-CM | POA: Diagnosis present

## 2020-02-09 DIAGNOSIS — M5030 Other cervical disc degeneration, unspecified cervical region: Secondary | ICD-10-CM

## 2020-02-09 DIAGNOSIS — M50322 Other cervical disc degeneration at C5-C6 level: Secondary | ICD-10-CM | POA: Diagnosis not present

## 2020-02-09 DIAGNOSIS — R200 Anesthesia of skin: Secondary | ICD-10-CM

## 2020-02-09 DIAGNOSIS — M2578 Osteophyte, vertebrae: Secondary | ICD-10-CM

## 2020-02-09 HISTORY — DX: Pure hypercholesterolemia, unspecified: E78.00

## 2020-02-09 LAB — COMPREHENSIVE METABOLIC PANEL
ALT: 20 U/L (ref 0–44)
AST: 23 U/L (ref 15–41)
Albumin: 3.8 g/dL (ref 3.5–5.0)
Alkaline Phosphatase: 66 U/L (ref 38–126)
Anion gap: 10 (ref 5–15)
BUN: 11 mg/dL (ref 6–20)
CO2: 23 mmol/L (ref 22–32)
Calcium: 9.5 mg/dL (ref 8.9–10.3)
Chloride: 105 mmol/L (ref 98–111)
Creatinine, Ser: 0.77 mg/dL (ref 0.44–1.00)
GFR calc non Af Amer: >60 mL/min (ref >60)
Glucose, Bld: 101 mg/dL — ABNORMAL HIGH (ref 70–99)
Potassium: 3.7 mmol/L (ref 3.5–5.1)
Sodium: 138 mmol/L (ref 135–145)
Total Bilirubin: 0.4 mg/dL (ref 0.3–1.2)
Total Protein: 6.9 g/dL (ref 6.5–8.1)

## 2020-02-09 LAB — DIFFERENTIAL
Abs Immature Granulocytes: 0.01 10*3/uL (ref 0.00–0.07)
Basophils Absolute: 0 10*3/uL (ref 0.0–0.1)
Basophils Relative: 0 %
Eosinophils Absolute: 0.1 10*3/uL (ref 0.0–0.5)
Eosinophils Relative: 2 %
Immature Granulocytes: 0 %
Lymphocytes Relative: 38 %
Lymphs Abs: 1.9 10*3/uL (ref 0.7–4.0)
Monocytes Absolute: 0.4 10*3/uL (ref 0.1–1.0)
Monocytes Relative: 8 %
Neutro Abs: 2.6 10*3/uL (ref 1.7–7.7)
Neutrophils Relative %: 52 %

## 2020-02-09 LAB — I-STAT CHEM 8, ED
BUN: 12 mg/dL (ref 6–20)
Calcium, Ion: 1.17 mmol/L (ref 1.15–1.40)
Chloride: 104 mmol/L (ref 98–111)
Creatinine, Ser: 0.7 mg/dL (ref 0.44–1.00)
Glucose, Bld: 99 mg/dL (ref 70–99)
HCT: 35 % — ABNORMAL LOW (ref 36.0–46.0)
Hemoglobin: 11.9 g/dL — ABNORMAL LOW (ref 12.0–15.0)
Potassium: 3.6 mmol/L (ref 3.5–5.1)
Sodium: 140 mmol/L (ref 135–145)
TCO2: 25 mmol/L (ref 22–32)

## 2020-02-09 LAB — CBG MONITORING, ED: Glucose-Capillary: 99 mg/dL (ref 70–99)

## 2020-02-09 LAB — CBC
HCT: 35.4 % — ABNORMAL LOW (ref 36.0–46.0)
Hemoglobin: 12.3 g/dL (ref 12.0–15.0)
MCH: 31.5 pg (ref 26.0–34.0)
MCHC: 34.7 g/dL (ref 30.0–36.0)
MCV: 90.8 fL (ref 80.0–100.0)
Platelets: 324 10*3/uL (ref 150–400)
RBC: 3.9 MIL/uL (ref 3.87–5.11)
RDW: 12.1 % (ref 11.5–15.5)
WBC: 5.1 10*3/uL (ref 4.0–10.5)
nRBC: 0 % (ref 0.0–0.2)

## 2020-02-09 LAB — APTT: aPTT: 31 seconds (ref 24–36)

## 2020-02-09 LAB — PROTIME-INR
INR: 1 (ref 0.8–1.2)
Prothrombin Time: 12.6 seconds (ref 11.4–15.2)

## 2020-02-09 LAB — MAGNESIUM: Magnesium: 1.7 mg/dL (ref 1.7–2.4)

## 2020-02-09 LAB — I-STAT BETA HCG BLOOD, ED (MC, WL, AP ONLY): I-stat hCG, quantitative: <5 m[IU]/mL (ref <5)

## 2020-02-09 MED ORDER — SODIUM CHLORIDE 0.9 % IV BOLUS
1000.0000 mL | Freq: Once | INTRAVENOUS | Status: AC
  Administered 2020-02-09: 1000 mL via INTRAVENOUS

## 2020-02-09 MED ORDER — SODIUM CHLORIDE 0.9% FLUSH
3.0000 mL | Freq: Once | INTRAVENOUS | Status: AC
Start: 2020-02-09 — End: 2020-02-09
  Administered 2020-02-09: 3 mL via INTRAVENOUS

## 2020-02-09 NOTE — Telephone Encounter (Signed)
[facility] Report/Call: --Caller states right hand and arm, right leg numbness. no pain. started this morning. no headache, no neck pain  Advised call EMS now.  Patient is at ED.

## 2020-02-09 NOTE — ED Triage Notes (Addendum)
Pt reports tingling and numbness to R hand every morning when waking up since Tuesday.  States it normally feels like she slept on hand and that it improves as the day goes on.  This morning she continued to have the tingling/numbness and it radiated up R arm.  Around 8am she also started having R leg numbness.  Speech clear.  No arm drift.  No leg drift.  Denies pain.  VAN negative.

## 2020-02-09 NOTE — ED Provider Notes (Signed)
[facility] EMERGENCY DEPARTMENT Provider Note   CSN: [id number] Arrival date & time: 02/09/20  1023     History Chief Complaint  Patient presents with  . Numbness    Angela Webb is a 49 y.o. female.  HPI 49 year old female presents with numbness and tingling.  At around 630 when she awoke and felt like her right hand was asleep.  She has had intermittent feeling in his hand like this for the past couple days but usually she can shake it out and seems to improve.  However it has not resolved.  The numbness is worse in her hand but goes all the way up to her shoulder.  Now it is more of a tingling.  At first she could not bend her hand closed and it felt like her fingers were swollen.  Around 8 AM she noticed similar symptoms in her foot and right lateral thigh.  She was having no difficulty walking.  No back pain or headache but she has been having about 4-6 weeks of right lateral neck pain.  No fevers or other acute illness besides a mild cold recently.  No headache or blurry vision. There was also some transient numbness in her right face.    Past Medical History:  Diagnosis Date  . Allergic rhinitis   . ALLERGIC RHINITIS 10/03/2007  . Complication of anesthesia   . GERD (gastroesophageal reflux disease)   . High cholesterol   . Hyperlipidemia   . HYPERLIPIDEMIA 08/08/2008  . Medical history non-contributory   . Neuromuscular disorder (HCC)    DDD  . PONV (postoperative nausea and vomiting)     Patient Active Problem List   Diagnosis Date Noted  . Left otitis externa 01/09/2020  . Right cervical radiculopathy 01/09/2020  . Bruising 12/28/2018  . Varicose veins of leg with pain, left 12/28/2018  . Leg cramping 12/28/2018  . Leg cramps 07/08/2018  . Acute gastroenteritis 06/09/2018  . Colitis 02/15/2018  . Frequent nosebleeds 06/22/2017  . Acute hearing loss, right 06/22/2017  . Lateral epicondylitis of left elbow 05/28/2017  . Acute shoulder bursitis,  right 04/22/2017  . Right knee pain 04/07/2017  . Early menopause 11/18/2016  . Thumb pain, right 11/05/2016  . Right arm pain 08/21/2015  . Low back pain 08/21/2015  . Lumbar degenerative disc disease 11/06/2014  . Hot flashes 06/05/2014  . Acute upper respiratory infection 06/05/2014  . Right shoulder pain 06/05/2014  . Bilateral leg pain 01/26/2014  . Lower back pain 08/22/2012  . GERD (gastroesophageal reflux disease) 08/20/2011  . Preventative health care 07/28/2010  . Hyperlipidemia 08/08/2008  . Allergic rhinitis 10/03/2007  . VERTIGO 10/03/2007    Past Surgical History:  Procedure Laterality Date  . EXCISION MASS UPPER EXTREMETIES Right 01/21/2017   Procedure: RIGHT THUMB EXCISION CYST AND DEBRIEDMENT DISTAL INTERPHALANGEAL JOINT;  Surgeon: Betha Loa, MD;  Location: [facility];  Service: Orthopedics;  Laterality: Right;  . I & D EXTREMITY Left 08/22/2015   Procedure: IRRIGATION AND DEBRIDEMENT EXTREMITY;  Surgeon: Betha Loa, MD;  Location: [facility];  Service: Orthopedics;  Laterality: Left;  I & d left thumb  . INCISION AND DRAINAGE Left 08/29/2015   Procedure: REPEAT INCISION AND DRAINAGE LEFT THUMB;  Surgeon: Betha Loa, MD;  Location: [facility];  Service: Orthopedics;  Laterality: Left;     OB History    Gravida  0   Para  0   Term  0  Preterm  0   AB  0   Living        SAB  0   TAB  0   Ectopic  0   Multiple      Live Births              Family History  Problem Relation Age of Onset  . Arthritis Mother   . Diabetes Brother        type I, deceased    Social History   Tobacco Use  . Smoking status: Never Smoker  . Smokeless tobacco: Never Used  Substance Use Topics  . Alcohol use: Yes    Alcohol/week: 1.0 standard drink    Types: 1 Glasses of wine per week    Comment: social  . Drug use: No    Home Medications Prior to Admission medications   Medication Sig Start Date End Date  Taking? Authorizing Provider  amoxicillin-clavulanate (AUGMENTIN) 875-125 MG tablet Take 1 tablet by mouth 2 (two) times daily. 01/18/20   Corwin Levins, MD  celecoxib (CELEBREX) 200 MG capsule TAKE ONE CAPSULE BY MOUTH TWICE A DAY AS NEEDED 01/17/18   Corwin Levins, MD  cyclobenzaprine (FLEXERIL) 5 MG tablet TAKE 1 TABLET (5 MG TOTAL) BY MOUTH DAILY. **ANNUAL APPOINTMENT NEEDED FOR ADDITIONAL REFILLS** 07/18/19   Corwin Levins, MD  estradiol (ESTRACE) 1 MG tablet Take 1 mg by mouth daily. 01/14/18   [provider]  HYDROcodone-acetaminophen (NORCO) 5-325 MG tablet 1 tabs po q6 hours prn pain 01/10/20   Corwin Levins, MD  ibuprofen (ADVIL) 800 MG tablet ibuprofen 800 mg tablet  TAKE 1 TABLET 3 TIMES A DAY OR AS NEEDED FOR DISCOMFORT    [provider]  ondansetron (ZOFRAN) 4 MG tablet TAKE 1 TABLET BY MOUTH EVERY 8 HOURS AS NEEDED FOR NAUSEA AND VOMITING 11/15/19   Corwin Levins, MD  pantoprazole (PROTONIX) 40 MG tablet Protonix 40 mg tablet,delayed release  Take 1 tablet every day by oral route.    [provider]  predniSONE (DELTASONE) 10 MG tablet 3 tabs by mouth per day for 3 days,2tabs per day for 3 days,1tab per day for 3 days 01/09/20   Corwin Levins, MD  progesterone (PROMETRIUM) 100 MG capsule Take 100 mg by mouth daily.    [provider]  progesterone (PROMETRIUM) 100 MG capsule Take 100 mg by mouth at bedtime. 09/20/19   [provider]  rosuvastatin (CRESTOR) 10 MG tablet TAKE 1 TABLET BY MOUTH EVERY DAY 07/24/19   Corwin Levins, MD  simvastatin (ZOCOR) 40 MG tablet Zocor 40 mg tablet  Take 1 tablet every day by oral route.    [provider]  triamcinolone (NASACORT ALLERGY 24HR) 55 MCG/ACT AERO nasal inhaler Nasacort    [provider]    Allergies    Levofloxacin, Levofloxacin, and Tramadol  Review of Systems   Review of Systems  Constitutional: Negative for fever.  Cardiovascular: Negative for chest pain.   Gastrointestinal: Negative for vomiting.  Musculoskeletal: Positive for neck pain. Negative for back pain.  Neurological: Positive for numbness. Negative for weakness and headaches.  All other systems reviewed and are negative.   Physical Exam Updated Vital Signs BP 118/82   Pulse 62   Temp 98.5 F (36.9 C) (Oral)   Resp 17   Ht 5' 3.5" (1.613 m)   Wt 59 kg   LMP 06/23/2012   SpO2 99%   BMI 22.67 kg/m  Physical Exam Vitals and nursing note reviewed.  Constitutional:      General: She is not in acute distress.    Appearance: She is well-developed. She is not ill-appearing or diaphoretic.  HENT:     Head: Normocephalic and atraumatic.     Right Ear: External ear normal.     Left Ear: External ear normal.     Nose: Nose normal.  Eyes:     General:        Right eye: No discharge.        Left eye: No discharge.     Extraocular Movements: Extraocular movements intact.     Pupils: Pupils are equal, round, and reactive to light.  Cardiovascular:     Rate and Rhythm: Normal rate and regular rhythm.     Pulses:          Radial pulses are 2+ on the right side and 2+ on the left side.       Dorsalis pedis pulses are 2+ on the right side and 2+ on the left side.     Heart sounds: Normal heart sounds.  Pulmonary:     Effort: Pulmonary effort is normal.     Breath sounds: Normal breath sounds.  Abdominal:     Palpations: Abdomen is soft.     Tenderness: There is no abdominal tenderness.  Skin:    General: Skin is warm and dry.  Neurological:     Mental Status: She is alert.     Comments: CN 3-12 grossly intact. 5/5 strength in all 4 extremities. Grossly normal sensation in left face/extremities. Subjective decreased sensation in right face and upper/lower extremities. Normal finger to nose.   Psychiatric:        Mood and Affect: Mood is not anxious.     ED Results / Procedures / Treatments   Labs (all labs ordered are listed, but only abnormal results are  displayed) Labs Reviewed  CBC - Abnormal; Notable for the following components:      Result Value   HCT 35.4 (*)    All other components within normal limits  COMPREHENSIVE METABOLIC PANEL - Abnormal; Notable for the following components:   Glucose, Bld 101 (*)    All other components within normal limits  I-STAT CHEM 8, ED - Abnormal; Notable for the following components:   Hemoglobin 11.9 (*)    HCT 35.0 (*)    All other components within normal limits  PROTIME-INR  APTT  DIFFERENTIAL  MAGNESIUM  CBG MONITORING, ED  I-STAT BETA HCG BLOOD, ED ([facility], [facility], [facility] ONLY)    EKG EKG Interpretation  Date/Time:  Friday February 09 2020 10:36:45 EDT Ventricular Rate:  77 PR Interval:  168 QRS Duration: 70 QT Interval:  392 QTC Calculation: 443 R Axis:   61 Text Interpretation: Normal sinus rhythm Cannot rule out Anterior infarct , age undetermined Abnormal ECG No old tracing to compare Confirmed by Pricilla Loveless [phone]) on 02/09/2020 10:51:57 AM   Radiology MR BRAIN WO CONTRAST  Result Date: 02/09/2020 CLINICAL DATA:  Right hand paresthesias, right leg numbness EXAM: MRI HEAD WITHOUT CONTRAST MRI CERVICAL SPINE WITHOUT CONTRAST TECHNIQUE: Multiplanar, multiecho pulse sequences of the brain and surrounding structures, and cervical spine, to include the craniocervical junction and cervicothoracic junction, were obtained without intravenous contrast. COMPARISON:  None. FINDINGS: MRI HEAD Brain: There is no acute infarction or intracranial hemorrhage. There is no intracranial mass, mass effect, or edema. There is no hydrocephalus or extra-axial fluid collection. Ventricles and  sulci are normal in size and configuration. No abnormal enhancement. Vascular: Major vessel flow voids at the skull base are preserved. Skull: Normal marrow signal is preserved. Sinuses/Orbits: Paranasal sinuses are aerated. Orbits are unremarkable. Other: Sella is unremarkable.  Mastoid air cells are clear. MRI CERVICAL  SPINE Alignment: Physiologic. Vertebrae: Vertebral body heights are maintained. There is no marrow edema. There is no suspicious osseous lesion. Cord: Normal caliber and signal. Posterior Fossa, vertebral arteries, paraspinal tissues: Unremarkable. Disc levels: Intervertebral disc heights and signal are maintained. Small right foraminal disc osteophyte complex at C5-C6 causing mild foraminal stenosis. IMPRESSION: Normal MRI of the brain. No abnormal cord signal. Small right foraminal disc osteophyte complex at C5-C6 causing mild stenosis. Electronically Signed   By: Guadlupe Spanish M.D.   On: 02/09/2020 15:51   MR Cervical Spine Wo Contrast  Result Date: 02/09/2020 CLINICAL DATA:  Right hand paresthesias, right leg numbness EXAM: MRI HEAD WITHOUT CONTRAST MRI CERVICAL SPINE WITHOUT CONTRAST TECHNIQUE: Multiplanar, multiecho pulse sequences of the brain and surrounding structures, and cervical spine, to include the craniocervical junction and cervicothoracic junction, were obtained without intravenous contrast. COMPARISON:  None. FINDINGS: MRI HEAD Brain: There is no acute infarction or intracranial hemorrhage. There is no intracranial mass, mass effect, or edema. There is no hydrocephalus or extra-axial fluid collection. Ventricles and sulci are normal in size and configuration. No abnormal enhancement. Vascular: Major vessel flow voids at the skull base are preserved. Skull: Normal marrow signal is preserved. Sinuses/Orbits: Paranasal sinuses are aerated. Orbits are unremarkable. Other: Sella is unremarkable.  Mastoid air cells are clear. MRI CERVICAL SPINE Alignment: Physiologic. Vertebrae: Vertebral body heights are maintained. There is no marrow edema. There is no suspicious osseous lesion. Cord: Normal caliber and signal. Posterior Fossa, vertebral arteries, paraspinal tissues: Unremarkable. Disc levels: Intervertebral disc heights and signal are maintained. Small right foraminal disc osteophyte complex at  C5-C6 causing mild foraminal stenosis. IMPRESSION: Normal MRI of the brain. No abnormal cord signal. Small right foraminal disc osteophyte complex at C5-C6 causing mild stenosis. Electronically Signed   By: Guadlupe Spanish M.D.   On: 02/09/2020 15:51    Procedures Procedures (including critical care time)  Medications Ordered in ED Medications  sodium chloride flush (NS) 0.9 % injection 3 mL (3 mLs Intravenous Given 02/09/20 1125)  sodium chloride 0.9 % bolus 1,000 mL (1,000 mLs Intravenous New Bag/Given 02/09/20 1210)    ED Course  I have reviewed the triage vital signs and the nursing notes.  Pertinent labs & imaging results that were available during my care of the patient were reviewed by me and considered in my medical decision making (see chart for details).    MDM Rules/Calculators/A&P                          Given the diffuse right-sided paresthesias, MRI was obtained of brain as well as her C-spine given her neck pain.  The brain is unremarkable.  C-spine shows small right foraminal osteophyte at C5-6 causing a little bit of stenosis.  This could have been causing her right hand symptoms but I doubt with such mild stenosis that would cause leg symptoms especially not her face.  Could be her paresthesias were related to recent viral illness.  However at this time her symptoms have completely resolved.  She appears stable for outpatient follow-up with neurosurgery for the C-spine findings. Final Clinical Impression(s) / ED Diagnoses Final diagnoses:  Right sided numbness  Degeneration of  intervertebral disc of cervical region with osteophyte of cervical vertebra    Rx / DC Orders ED Discharge Orders    None       Pricilla LovelessGoldston, Boniface Goffe, MD 02/09/20 1625

## 2020-02-09 NOTE — ED Notes (Signed)
Reviewed discharge instructions with patient and spouse. Follow-up care reviewed. Patient and spouse verbalized understanding. Patient A&Ox4, VSS, and ambulatory with steady gait upon discharge. 

## 2020-02-09 NOTE — ED Notes (Signed)
Patient transported to MRI 

## 2020-02-12 ENCOUNTER — Other Ambulatory Visit: Payer: Self-pay | Admitting: Internal Medicine

## 2020-02-12 ENCOUNTER — Telehealth: Payer: Self-pay | Admitting: Internal Medicine

## 2020-02-12 MED ORDER — CYCLOBENZAPRINE HCL 5 MG PO TABS
5.0000 mg | ORAL_TABLET | Freq: Every day | ORAL | 5 refills | Status: DC
Start: 2020-02-12 — End: 2021-02-23

## 2020-02-12 NOTE — Telephone Encounter (Signed)
Sent to Dr. John. 

## 2020-02-12 NOTE — Telephone Encounter (Signed)
cyclobenzaprine (FLEXERIL) 5 MG tablet  HYDROcodone-acetaminophen (NORCO) 5-325 MG tablet  [id number] - [place], [place] - [address] Phone:  [phone]  Fax:  [phone]     Last appt: 9.7.21 Next appt: not scheduled

## 2020-02-13 ENCOUNTER — Other Ambulatory Visit: Payer: Self-pay | Admitting: Internal Medicine

## 2020-02-13 MED ORDER — HYDROCODONE-ACETAMINOPHEN 5-325 MG PO TABS
ORAL_TABLET | ORAL | 0 refills | Status: DC
Start: 2020-02-13 — End: 2020-03-25

## 2020-02-13 NOTE — Telephone Encounter (Signed)
Done erx 

## 2020-03-19 ENCOUNTER — Telehealth (INDEPENDENT_AMBULATORY_CARE_PROVIDER_SITE_OTHER): Payer: No Typology Code available for payment source | Admitting: Family Medicine

## 2020-03-19 DIAGNOSIS — R059 Cough, unspecified: Secondary | ICD-10-CM

## 2020-03-19 DIAGNOSIS — R0981 Nasal congestion: Secondary | ICD-10-CM

## 2020-03-19 MED ORDER — AMOXICILLIN-POT CLAVULANATE 875-125 MG PO TABS: 1.0000 | ORAL_TABLET | Freq: Two times a day (BID) | ORAL | 0 refills | Status: DC

## 2020-03-19 MED ORDER — BENZONATATE 100 MG PO CAPS: 100.0000 mg | ORAL_CAPSULE | Freq: Three times a day (TID) | ORAL | 0 refills | Status: DC | PRN

## 2020-03-19 NOTE — Patient Instructions (Addendum)
   It was nice to meet you today, and I really hope you are feeling better soon. I help [facility] [facility] out with telemedicine visits on Tuesdays and Thursdays and am available for visits on those days. If you have any concerns or questions following this visit please schedule a follow up visit with your Primary Care doctor or seek care at a local urgent care clinic to avoid delays in care.    Seek in person care promptly if your symptoms worsen, new concerns arise or you are not improving with treatment. Call 911 and/or seek emergency care if you symptoms are severe or life threatening.   -stay home while sick, and if you have COVID19 please stay home for a full 10 days since the onset of symptoms PLUS one day of no fever and feeling better  -[place] COVID19 testing information: [email or web address] [place] [phone] Most pharmacies also offer testing or home test kits. Please follow up with your Primary Care Doctor if testing is positive.   -I sent the medication(s) we discussed to your pharmacy: Meds ordered this encounter  Medications  . amoxicillin-clavulanate (AUGMENTIN) 875-125 MG tablet    Sig: Take 1 tablet by mouth 2 (two) times daily.    Dispense:  20 tablet    Refill:  0  . benzonatate (TESSALON PERLES) 100 MG capsule    Sig: Take 1 capsule (100 mg total) by mouth 3 (three) times daily as needed.    Dispense:  20 capsule    Refill:  0    -can use tylenol or aleve if needed for fevers, aches and pains per instructions  -can use nasal saline a few times per day if nasal congestion, sometime a short course of Afrin nasal spray for 3 days can help as well  -stay hydrated, drink plenty of fluids and eat small healthy meals - avoid dairy  -can take 1000 IU Vit D3 and Vit C lozenges per instructions  -follow up with your doctor in 2-3 days unless improving and feeling better

## 2020-03-19 NOTE — Progress Notes (Signed)
Virtual Visit via Telephone Note  I connected with Angela Webb on 03/19/20 at  6:00 PM EST by telephone and verified that I am speaking with the correct person using two identifiers.   I discussed the limitations, risks, security and privacy concerns of performing an evaluation and management service by telephone and the availability of in person appointments. I also discussed with the patient that there may be a patient responsible charge related to this service. The patient expressed understanding and agreed to proceed.  Location patient: home, [place] Location provider: work or home office Participants present for the call: patient, provider Patient did not have a visit with me in the prior 7 days to address this/these issue(s).   History of Present Illness:  Acute telemedicine visit for cough: -Onset: about 6 - 7 days ago -Symptoms include: sore throat, tickle in the throat, cough - worse last night and did not get much sleep, nasal congestion, now hacking up yellow and green mucus -Denies:fevers, SOB, CP, wheezing, chest tightness, malaise -grandchild has had a cold - she is in daycare -Has tried: nasal saline -Pertinent past medical history: none -Pertinent medication allergies: levofloxacin, tramadol -COVID-19 vaccine status: not vaccinated, has not had her flu shot   Observations/Objective: Patient sounds cheerful and well on the phone. I do not appreciate any SOB. Speech and thought processing are grossly intact. Patient reported vitals:  Assessment and Plan:  Cough  Nasal congestion  -we discussed possible serious and likely etiologies, options for evaluation and workup, limitations of telemedicine visit vs in person visit, treatment, treatment risks and precautions. Pt prefers to treat via telemedicine empirically rather than in person at this moment.  Discussed potential etiologies including viral, versus developing bacterial sinusitis or lower respiratory infection  versus other.  She opted for trial of cough medication, but if not improving Augmentin 875 twice daily for 7 to 10 days.  Discussed the potential for COVID-19, testing options, treatment, potential complications and precautions.  Advised staying home while sick and per protocol if Covid testing positive. Work/School slipped offered:  declined Scheduled follow up with PCP offered: Agrees to follow-up if needed. Advised to seek prompt in person care if worsening, new symptoms arise, or if is not improving with treatment. Advised of options for inperson care in [place] PCP office not available.  Follow Up Instructions:  I did not refer this patient for an OV with me in the next 24 hours for this/these issue(s).  I discussed the assessment and treatment plan with the patient. The patient was provided an opportunity to ask questions and all were answered. The patient agreed with the plan and demonstrated an understanding of the instructions.   I spent 23 minutes on this encounter.   Terressa Koyanagi, DO

## 2020-03-25 ENCOUNTER — Telehealth: Payer: Self-pay | Admitting: Internal Medicine

## 2020-03-25 MED ORDER — HYDROCODONE-ACETAMINOPHEN 5-325 MG PO TABS
ORAL_TABLET | ORAL | 0 refills | Status: DC
Start: 2020-03-25 — End: 2020-08-01

## 2020-03-25 NOTE — Telephone Encounter (Signed)
Ok done erx  Ok to let pt know, the [facility] policy for narcotic have changed so I will need to call her at some point to explain about signing a [facility] pain contract and having urinary drug screen done  

## 2020-03-30 ENCOUNTER — Other Ambulatory Visit: Payer: Self-pay | Admitting: Internal Medicine

## 2020-04-01 ENCOUNTER — Telehealth: Payer: Self-pay | Admitting: Internal Medicine

## 2020-04-01 ENCOUNTER — Other Ambulatory Visit: Payer: Self-pay | Admitting: Internal Medicine

## 2020-04-01 MED ORDER — PANTOPRAZOLE SODIUM 40 MG PO TBEC
DELAYED_RELEASE_TABLET | ORAL | 2 refills | Status: DC
Start: 2020-04-01 — End: 2020-04-01

## 2020-04-01 MED ORDER — PANTOPRAZOLE SODIUM 40 MG PO TBEC
DELAYED_RELEASE_TABLET | ORAL | 2 refills | Status: DC
Start: 2020-04-01 — End: 2023-09-07

## 2020-04-01 NOTE — Telephone Encounter (Signed)
Patient is requesting a refill on the following medication. Informed patient it has not been filled in sometime and I don't see Dr.John called it in. Informed she may need an appointment.  pantoprazole (PROTONIX) 40 MG tablet  Pharmacy on file.

## 2020-04-01 NOTE — Telephone Encounter (Signed)
Sent to Dr. John to advise. 

## 2020-04-01 NOTE — Addendum Note (Signed)
Addended by: Corwin Levins on: 04/01/2020 05:41 PM   Modules accepted: Orders

## 2020-04-01 NOTE — Telephone Encounter (Signed)
Patient called requesting refill be sent in. Informed patient RX was sent in on 11/22, To call pharmacy and see if they have it ready.

## 2020-05-30 ENCOUNTER — Telehealth: Payer: Self-pay

## 2020-05-30 NOTE — Telephone Encounter (Signed)
LVM instructing pt to call office to clarify if insurance is going to cover Pantoprazole.

## 2020-06-27 ENCOUNTER — Other Ambulatory Visit: Payer: Self-pay | Admitting: Internal Medicine

## 2020-06-27 NOTE — Telephone Encounter (Signed)
Please refill as per office routine med refill policy (all routine meds refilled for 3 mo or monthly per pt preference up to one year from last visit, then month to month grace period for 3 mo, then further med refills will have to be denied)  

## 2020-07-01 MED ORDER — ROSUVASTATIN CALCIUM 10 MG PO TABS
10.0000 mg | ORAL_TABLET | Freq: Every day | ORAL | 11 refills | Status: DC
Start: 2020-07-01 — End: 2023-10-08

## 2020-07-10 ENCOUNTER — Telehealth (INDEPENDENT_AMBULATORY_CARE_PROVIDER_SITE_OTHER): Payer: Self-pay | Admitting: Family

## 2020-07-10 DIAGNOSIS — J029 Acute pharyngitis, unspecified: Secondary | ICD-10-CM

## 2020-07-10 MED ORDER — AZITHROMYCIN 250 MG PO TABS: ORAL_TABLET | ORAL | 0 refills | Status: DC

## 2020-07-10 NOTE — Progress Notes (Signed)
Angela Webb is a 50 y.o. female with the following history as recorded in [employer]:  Patient Active Problem List   Diagnosis Date Noted  . Left otitis externa 01/09/2020  . Right cervical radiculopathy 01/09/2020  . Bruising 12/28/2018  . Varicose veins of leg with pain, left 12/28/2018  . Leg cramping 12/28/2018  . Leg cramps 07/08/2018  . Acute gastroenteritis 06/09/2018  . Colitis 02/15/2018  . Frequent nosebleeds 06/22/2017  . Acute hearing loss, right 06/22/2017  . Lateral epicondylitis of left elbow 05/28/2017  . Acute shoulder bursitis, right 04/22/2017  . Right knee pain 04/07/2017  . Early menopause 11/18/2016  . Thumb pain, right 11/05/2016  . Right arm pain 08/21/2015  . Low back pain 08/21/2015  . Lumbar degenerative disc disease 11/06/2014  . Hot flashes 06/05/2014  . Acute upper respiratory infection 06/05/2014  . Right shoulder pain 06/05/2014  . Bilateral leg pain 01/26/2014  . Lower back pain 08/22/2012  . GERD (gastroesophageal reflux disease) 08/20/2011  . Preventative health care 07/28/2010  . Hyperlipidemia 08/08/2008  . Allergic rhinitis 10/03/2007  . VERTIGO 10/03/2007    Current Outpatient Medications  Medication Sig Dispense Refill  . azithromycin (ZITHROMAX) 250 MG tablet 2 tabs po qd x 1 day; 1 tablet per day x 4 days; 6 tablet 0  . cyclobenzaprine (FLEXERIL) 5 MG tablet Take 1 tablet (5 mg total) by mouth daily. **ANNUAL APPOINTMENT NEEDED FOR ADDITIONAL REFILLS** 30 tablet 5  . estradiol (ESTRACE) 1 MG tablet Take 1 mg by mouth daily.  6  . HYDROcodone-acetaminophen (NORCO) 5-325 MG tablet 1 tabs po q6 hours prn pain 30 tablet 0  . ibuprofen (ADVIL) 800 MG tablet ibuprofen 800 mg tablet  TAKE 1 TABLET 3 TIMES A DAY OR AS NEEDED FOR DISCOMFORT    . ondansetron (ZOFRAN) 4 MG tablet TAKE 1 TABLET BY MOUTH EVERY 8 HOURS AS NEEDED FOR NAUSEA AND VOMITING 18 tablet 1  . pantoprazole (PROTONIX) 40 MG tablet Take 1 tablet every day by oral route. 90  tablet 2  . progesterone (PROMETRIUM) 100 MG capsule Take 100 mg by mouth daily.    . progesterone (PROMETRIUM) 100 MG capsule Take 100 mg by mouth at bedtime.    . rosuvastatin (CRESTOR) 10 MG tablet TAKE 1 TABLET BY MOUTH EVERY DAY 30 tablet 0  . rosuvastatin (CRESTOR) 10 MG tablet Take 1 tablet (10 mg total) by mouth daily. 30 tablet 11  . triamcinolone (NASACORT ALLERGY 24HR) 55 MCG/ACT AERO nasal inhaler Nasacort     No current facility-administered medications for this visit.    Allergies: Levofloxacin, Levofloxacin, and Tramadol  Past Medical History:  Diagnosis Date  . Allergic rhinitis   . ALLERGIC RHINITIS 10/03/2007  . Complication of anesthesia   . GERD (gastroesophageal reflux disease)   . High cholesterol   . Hyperlipidemia   . HYPERLIPIDEMIA 08/08/2008  . Medical history non-contributory   . Neuromuscular disorder (HCC)    DDD  . PONV (postoperative nausea and vomiting)     Past Surgical History:  Procedure Laterality Date  . EXCISION MASS UPPER EXTREMETIES Right 01/21/2017   Procedure: RIGHT THUMB EXCISION CYST AND DEBRIEDMENT DISTAL INTERPHALANGEAL JOINT;  Surgeon: Betha Loa, MD;  Location: [facility];  Service: Orthopedics;  Laterality: Right;  . I & D EXTREMITY Left 08/22/2015   Procedure: IRRIGATION AND DEBRIDEMENT EXTREMITY;  Surgeon: Betha Loa, MD;  Location: [facility];  Service: Orthopedics;  Laterality: Left;  I & d left thumb  .  INCISION AND DRAINAGE Left 08/29/2015   Procedure: REPEAT INCISION AND DRAINAGE LEFT THUMB;  Surgeon: Betha Loa, MD;  Location: [facility];  Service: Orthopedics;  Laterality: Left;    Family History  Problem Relation Age of Onset  . Arthritis Mother   . Diabetes Brother        type I, deceased    Social History   Tobacco Use  . Smoking status: Never Smoker  . Smokeless tobacco: Never Used  Substance Use Topics  . Alcohol use: Yes    Alcohol/week: 1.0 standard drink    Types:  1 Glasses of wine per week    Comment: social    Subjective:   I connected with Angela Webb on 07/10/20 at 11:20 AM EST by a telephone call and verified that I am speaking with the correct person using two identifiers.   I discussed the limitations of evaluation and management by telemedicine and the availability of in person appointments. The patient expressed understanding and agreed to proceed. Provider in office/ patient is at home; provider and patient are only 2 people on telephone call.   4-5 day history of sore throat/ headache/ body aches; has not tested for COVID- does not want to test for COVID; husband has similar symptoms; no specific exposure to strep throat;      Objective:  There were no vitals filed for this visit.  Lungs: Respirations unlabored;  Neurologic: Alert and oriented; speech intact;   Assessment:  1. Sore throat     Plan:  Symptoms are concerning for COVID but patient defers testing; Rx for Z-pak #1 take as directed; increase fluids, rest and follow up worse, no better.   Time spent 8 minutes   No follow-ups on file.  No orders of the defined types were placed in this encounter.   Requested Prescriptions   Signed Prescriptions Disp Refills  . azithromycin (ZITHROMAX) 250 MG tablet 6 tablet 0    Sig: 2 tabs po qd x 1 day; 1 tablet per day x 4 days;

## 2020-08-01 ENCOUNTER — Other Ambulatory Visit: Payer: Self-pay | Admitting: Internal Medicine

## 2020-08-01 ENCOUNTER — Other Ambulatory Visit: Payer: Self-pay | Admitting: Family

## 2020-08-01 MED ORDER — HYDROCODONE-ACETAMINOPHEN 5-325 MG PO TABS
ORAL_TABLET | ORAL | 0 refills | Status: DC
Start: 2020-08-01 — End: 2020-09-10

## 2020-09-10 ENCOUNTER — Other Ambulatory Visit: Payer: Self-pay | Admitting: Internal Medicine

## 2020-09-10 MED ORDER — HYDROCODONE-ACETAMINOPHEN 5-325 MG PO TABS
ORAL_TABLET | ORAL | 0 refills | Status: DC
Start: 2020-09-10 — End: 2020-09-12

## 2020-09-10 NOTE — Telephone Encounter (Signed)
Ok done erx  But please to let pt know, that as of about aug 1 I will no longer be able to prescribe this medication as I am voluntarily trying to get away from chronic pain management with all the rules and risk that goes with this.    I can refer to pain management if she likes, if she thinks she will need this type of medication after about Aug 1  Just let me know

## 2020-09-12 ENCOUNTER — Other Ambulatory Visit: Payer: Self-pay

## 2020-09-12 ENCOUNTER — Telehealth (INDEPENDENT_AMBULATORY_CARE_PROVIDER_SITE_OTHER): Payer: Self-pay | Admitting: Internal Medicine

## 2020-09-12 ENCOUNTER — Encounter: Payer: Self-pay | Admitting: Internal Medicine

## 2020-09-12 DIAGNOSIS — M545 Low back pain, unspecified: Secondary | ICD-10-CM

## 2020-09-12 DIAGNOSIS — J309 Allergic rhinitis, unspecified: Secondary | ICD-10-CM

## 2020-09-12 DIAGNOSIS — R040 Epistaxis: Secondary | ICD-10-CM

## 2020-09-12 DIAGNOSIS — G8929 Other chronic pain: Secondary | ICD-10-CM

## 2020-09-12 MED ORDER — PREDNISONE 10 MG PO TABS: ORAL_TABLET | ORAL | 0 refills | Status: DC

## 2020-09-12 MED ORDER — MONTELUKAST SODIUM 10 MG PO TABS: 10.0000 mg | ORAL_TABLET | Freq: Every day | ORAL | 3 refills | Status: DC

## 2020-09-12 MED ORDER — HYDROCODONE-ACETAMINOPHEN 5-325 MG PO TABS
ORAL_TABLET | ORAL | 0 refills | Status: DC
Start: 2020-09-12 — End: 2023-10-08

## 2020-09-12 NOTE — Assessment & Plan Note (Signed)
With moderate to severe seasonal flare - for restart nasacort, cont zyrtec, add singulari 10 qd, and predpac asd,  to f/u any worsening symptoms or concerns, consider allergy referral

## 2020-09-12 NOTE — Patient Instructions (Signed)
Please take all new medication as prescribed - the singulair, prednisone  Please continue all other medications as before, and refills have been done for the hydrocodone, and continue the zyrtec and nasacort  Please have the pharmacy call with any other refills you may need.  Please keep your appointments with your specialists as you may have planned  You will be contacted regarding the referral for: Pain management

## 2020-09-12 NOTE — Assessment & Plan Note (Signed)
Chronic stable, for norco refill, but also refer to pain management for long term tx

## 2020-09-12 NOTE — Progress Notes (Signed)
**Note Angela-Identified via Obfuscation** Patient ID: Angela Webb, female   DOB: [birth year], 50 y.o.   MRN: [id number]  Virtual Visit via Video Note  I connected with Angela Webb on 09/12/20 at  8:40 AM EDT by a video enabled telemedicine application and verified that I am speaking with the correct person using two identifiers.  Location of all participants today Patient: at home Provider: at office   I discussed the limitations of evaluation and management by telemedicine and the availability of in person appointments. The patient expressed understanding and agreed to proceed.  History of Present Illness: Here to f/u - Does have several wks ongoing nasal allergy symptoms with clearish congestion, itch and sneezing, without fever, pain, ST, cough, swelling or wheezing, but has worsening congestion and post nasal gtt, keeps waking her up at night last few nights, just feels out of control, despite taking her zyrtec.  Has nasacort at home but not taking that recently.   Pt denies fever, wt loss, night sweats, loss of appetite, or other constitutional symptoms   Pt denies polydipsia, polyuria, or new focal neuro s/s.  Also, Pt continues to have recurring LBP without change in severity, bowel or bladder change, fever, wt loss,  worsening LE pain/numbness/weakness, gait change or falls. Past Medical History:  Diagnosis Date  . Allergic rhinitis   . ALLERGIC RHINITIS 10/03/2007  . Complication of anesthesia   . GERD (gastroesophageal reflux disease)   . High cholesterol   . Hyperlipidemia   . HYPERLIPIDEMIA 08/08/2008  . Medical history non-contributory   . Neuromuscular disorder (HCC)    DDD  . PONV (postoperative nausea and vomiting)    Past Surgical History:  Procedure Laterality Date  . EXCISION MASS UPPER EXTREMETIES Right 01/21/2017   Procedure: RIGHT THUMB EXCISION CYST AND DEBRIEDMENT DISTAL INTERPHALANGEAL JOINT;  Surgeon: Betha Loa, MD;  Location: [facility];  Service: Orthopedics;  Laterality: Right;  .  I & D EXTREMITY Left 08/22/2015   Procedure: IRRIGATION AND DEBRIDEMENT EXTREMITY;  Surgeon: Betha Loa, MD;  Location: [facility];  Service: Orthopedics;  Laterality: Left;  I & d left thumb  . INCISION AND DRAINAGE Left 08/29/2015   Procedure: REPEAT INCISION AND DRAINAGE LEFT THUMB;  Surgeon: Betha Loa, MD;  Location: [facility];  Service: Orthopedics;  Laterality: Left;    reports that she has never smoked. She has never used smokeless tobacco. She reports current alcohol use of about 1.0 standard drink of alcohol per week. She reports that she does not use drugs. family history includes Arthritis in her mother; Diabetes in her brother. Allergies  Allergen Reactions  . Levofloxacin Anaphylaxis  . Levofloxacin     REACTION: LEG PAIN/CRAMPS  . Tramadol Nausea Only   Current Outpatient Medications on File Prior to Visit  Medication Sig Dispense Refill  . cyclobenzaprine (FLEXERIL) 5 MG tablet Take 1 tablet (5 mg total) by mouth daily. **ANNUAL APPOINTMENT NEEDED FOR ADDITIONAL REFILLS** 30 tablet 5  . estradiol (ESTRACE) 1 MG tablet Take 1 mg by mouth daily.  6  . ibuprofen (ADVIL) 800 MG tablet ibuprofen 800 mg tablet  TAKE 1 TABLET 3 TIMES A DAY OR AS NEEDED FOR DISCOMFORT    . ondansetron (ZOFRAN) 4 MG tablet TAKE 1 TABLET BY MOUTH EVERY 8 HOURS AS NEEDED FOR NAUSEA AND VOMITING 18 tablet 1  . pantoprazole (PROTONIX) 40 MG tablet Take 1 tablet every day by oral route. 90 tablet 2  . progesterone (PROMETRIUM) 100 MG capsule Take  100 mg by mouth daily.    . progesterone (PROMETRIUM) 100 MG capsule Take 100 mg by mouth at bedtime.    . rosuvastatin (CRESTOR) 10 MG tablet TAKE 1 TABLET BY MOUTH EVERY DAY 30 tablet 0  . rosuvastatin (CRESTOR) 10 MG tablet Take 1 tablet (10 mg total) by mouth daily. 30 tablet 11  . triamcinolone (NASACORT ALLERGY 24HR) 55 MCG/ACT AERO nasal inhaler Nasacort     No current facility-administered medications on file prior to  visit.    Observations/Objective: Alert, NAD, appropriate mood and affect, resps normal, cn 2-12 intact, moves all 4s, no visible rash or swelling Lab Results  Component Value Date   WBC 5.1 02/09/2020   HGB 11.9 (L) 02/09/2020   HCT 35.0 (L) 02/09/2020   PLT 324 02/09/2020   GLUCOSE 99 02/09/2020   CHOL 195 12/28/2018   TRIG 157.0 (H) 12/28/2018   HDL 79.90 12/28/2018   LDLDIRECT 108.8 08/22/2012   LDLCALC 84 12/28/2018   ALT 20 02/09/2020   AST 23 02/09/2020   NA 140 02/09/2020   K 3.6 02/09/2020   CL 104 02/09/2020   CREATININE 0.70 02/09/2020   BUN 12 02/09/2020   CO2 23 02/09/2020   TSH 0.96 12/28/2018   INR 1.0 02/09/2020   Assessment and Plan: See notes  Follow Up Instructions: See notes   I discussed the assessment and treatment plan with the patient. The patient was provided an opportunity to ask questions and all were answered. The patient agreed with the plan and demonstrated an understanding of the instructions.   The patient was advised to call back or seek an in-person evaluation if the symptoms worsen or if the condition fails to improve as anticipated.  Oliver Barre, MD

## 2020-09-12 NOTE — Assessment & Plan Note (Signed)
resoved with change of flonase to nasacort ,  to f/u any worsening symptoms or concerns

## 2020-10-08 ENCOUNTER — Encounter: Payer: Self-pay | Admitting: Physical Medicine and Rehabilitation

## 2020-10-29 ENCOUNTER — Other Ambulatory Visit: Payer: Self-pay | Admitting: Internal Medicine

## 2020-10-29 NOTE — Telephone Encounter (Signed)
Please refill as per office routine med refill policy (all routine meds refilled for 3 mo or monthly per pt preference up to one year from last visit, then month to month grace period for 3 mo, then further med refills will have to be denied)  

## 2020-12-09 ENCOUNTER — Encounter
Payer: BLUE CROSS/BLUE SHIELD | Attending: Physical Medicine and Rehabilitation | Admitting: Physical Medicine and Rehabilitation

## 2021-02-23 ENCOUNTER — Other Ambulatory Visit: Payer: Self-pay | Admitting: Internal Medicine

## 2021-07-17 ENCOUNTER — Other Ambulatory Visit: Payer: Self-pay | Admitting: Internal Medicine

## 2021-07-17 NOTE — Telephone Encounter (Signed)
Please refill as per office routine med refill policy (all routine meds to be refilled for 3 mo or monthly (per pt preference) up to one year from last visit, then month to month grace period for 3 mo, then further med refills will have to be denied) ? ?

## 2021-09-18 ENCOUNTER — Other Ambulatory Visit: Payer: Self-pay | Admitting: Internal Medicine

## 2021-11-05 DIAGNOSIS — M25522 Pain in left elbow: Secondary | ICD-10-CM | POA: Diagnosis not present

## 2021-11-05 DIAGNOSIS — M5136 Other intervertebral disc degeneration, lumbar region: Secondary | ICD-10-CM | POA: Diagnosis not present

## 2022-01-08 DIAGNOSIS — D649 Anemia, unspecified: Secondary | ICD-10-CM | POA: Diagnosis not present

## 2022-01-08 DIAGNOSIS — M5136 Other intervertebral disc degeneration, lumbar region: Secondary | ICD-10-CM | POA: Diagnosis not present

## 2022-01-08 DIAGNOSIS — M545 Low back pain, unspecified: Secondary | ICD-10-CM | POA: Diagnosis not present

## 2022-01-08 DIAGNOSIS — K219 Gastro-esophageal reflux disease without esophagitis: Secondary | ICD-10-CM | POA: Diagnosis not present

## 2022-01-08 DIAGNOSIS — G8929 Other chronic pain: Secondary | ICD-10-CM | POA: Diagnosis not present

## 2022-01-08 DIAGNOSIS — Z780 Asymptomatic menopausal state: Secondary | ICD-10-CM | POA: Diagnosis not present

## 2022-01-08 DIAGNOSIS — E782 Mixed hyperlipidemia: Secondary | ICD-10-CM | POA: Diagnosis not present

## 2022-01-08 DIAGNOSIS — N951 Menopausal and female climacteric states: Secondary | ICD-10-CM | POA: Diagnosis not present

## 2022-01-08 DIAGNOSIS — R1013 Epigastric pain: Secondary | ICD-10-CM | POA: Diagnosis not present

## 2022-01-08 DIAGNOSIS — Z0001 Encounter for general adult medical examination with abnormal findings: Secondary | ICD-10-CM | POA: Diagnosis not present

## 2022-01-08 DIAGNOSIS — J302 Other seasonal allergic rhinitis: Secondary | ICD-10-CM | POA: Diagnosis not present

## 2022-01-08 DIAGNOSIS — Z0000 Encounter for general adult medical examination without abnormal findings: Secondary | ICD-10-CM | POA: Diagnosis not present

## 2022-01-08 DIAGNOSIS — M5412 Radiculopathy, cervical region: Secondary | ICD-10-CM | POA: Diagnosis not present

## 2022-01-08 DIAGNOSIS — D72819 Decreased white blood cell count, unspecified: Secondary | ICD-10-CM | POA: Diagnosis not present

## 2022-02-09 DIAGNOSIS — Z0000 Encounter for general adult medical examination without abnormal findings: Secondary | ICD-10-CM | POA: Diagnosis not present

## 2022-02-09 DIAGNOSIS — K219 Gastro-esophageal reflux disease without esophagitis: Secondary | ICD-10-CM | POA: Diagnosis not present

## 2022-02-09 DIAGNOSIS — E782 Mixed hyperlipidemia: Secondary | ICD-10-CM | POA: Diagnosis not present

## 2022-02-09 DIAGNOSIS — N951 Menopausal and female climacteric states: Secondary | ICD-10-CM | POA: Diagnosis not present

## 2022-02-09 DIAGNOSIS — D649 Anemia, unspecified: Secondary | ICD-10-CM | POA: Diagnosis not present

## 2022-02-09 DIAGNOSIS — D72819 Decreased white blood cell count, unspecified: Secondary | ICD-10-CM | POA: Diagnosis not present

## 2022-02-09 DIAGNOSIS — M5412 Radiculopathy, cervical region: Secondary | ICD-10-CM | POA: Diagnosis not present

## 2022-03-28 IMAGING — MR MR HEAD W/O CM
19 of 28 series · 31 of 48 positions shown · non-contrast
Comparison: None.

CLINICAL DATA: Right hand paresthesias, right leg numbness

EXAM:
MRI HEAD WITHOUT CONTRAST
MRI CERVICAL SPINE WITHOUT CONTRAST
TECHNIQUE: Multiplanar, multiecho pulse sequences of the brain and surrounding
structures, and cervical spine, to include the craniocervical
junction and cervicothoracic junction, were obtained without
intravenous contrast.

[Series 3: DWI · axial · 3.0mm · 1.09mm/px · z∈[-98,+52]mm · 4 of 104 slices shown (1 of 6)]
[im 1/104]
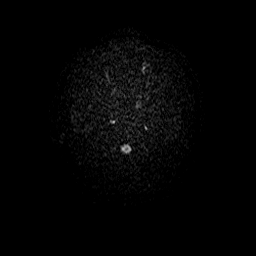
[im 35/104]
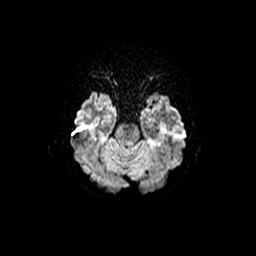
[im 69/104]
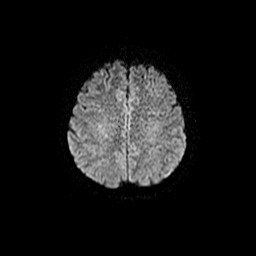
[im 104/104]
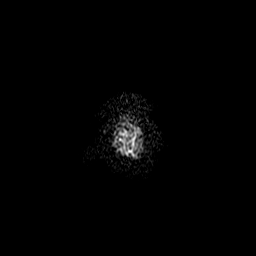

[Series 3: DWI · axial · 3.0mm · 1.09mm/px · z∈[-98,+52]mm · 4 of 104 slices shown (2 of 6)]
[im 1/104]
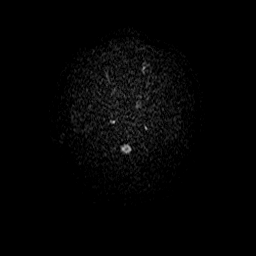
[im 35/104]
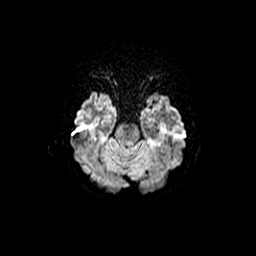
[im 69/104]
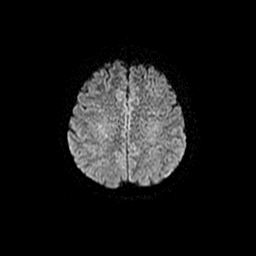
[im 104/104]
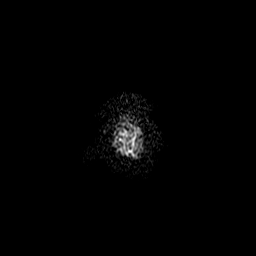

[Series 4: DWI · coronal · 5.0mm · 1.09mm/px · 3 of 72 slices shown (3 of 6)]
[im 1/72]
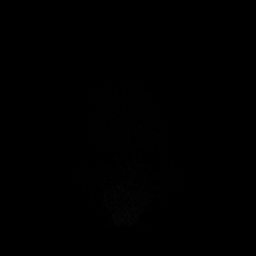
[im 36/72]
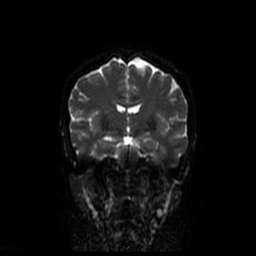
[im 72/72]
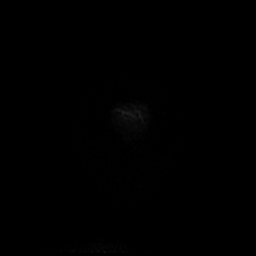

[Series 4: DWI · coronal · 5.0mm · 1.09mm/px · 3 of 72 slices shown (4 of 6)]
[im 1/72]
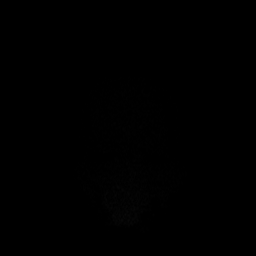
[im 36/72]
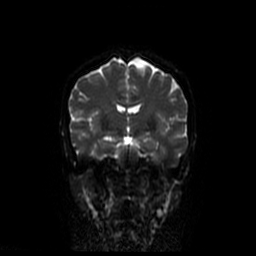
[im 72/72]
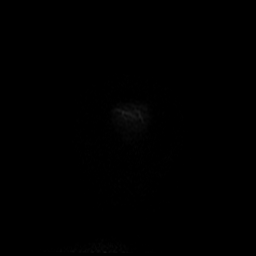

[Series 6: T1 · sagittal · 5.0mm · 0.47mm/px · 1 of 25 slices shown (1 of 3)]
[im 1/25]
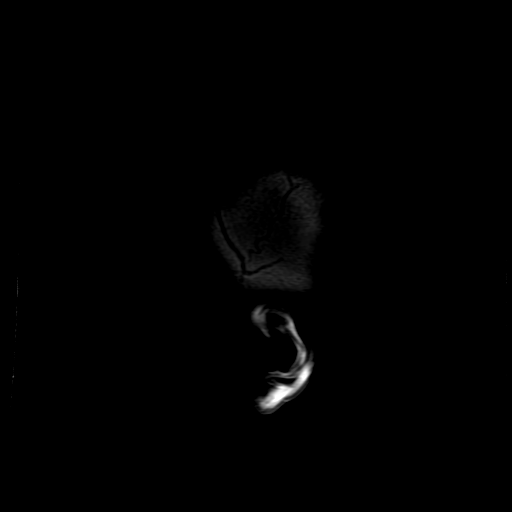

[Series 6: T1 · sagittal · 5.0mm · 0.47mm/px · 1 of 25 slices shown (2 of 3)]
[im 1/25]
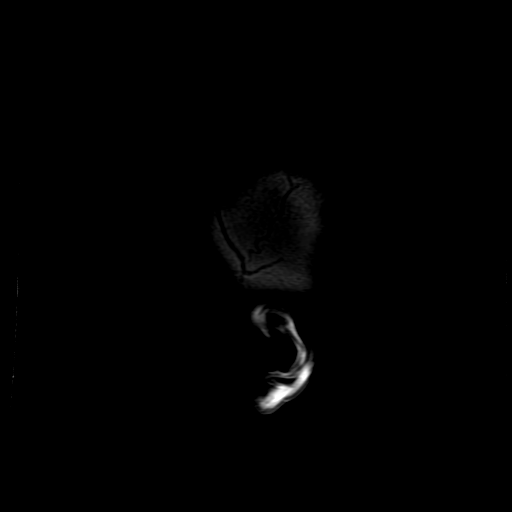

[Series 7: T2 · axial · 5.0mm · 0.43mm/px · 1 of 26 slices shown (1 of 8)]
[im 1/26]
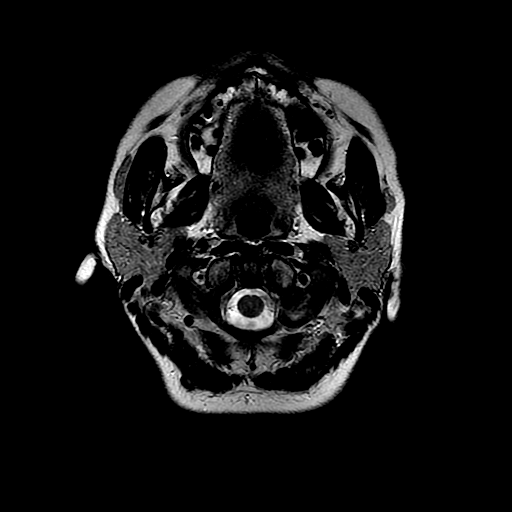

[Series 7: T2 · axial · 5.0mm · 0.43mm/px · 1 of 26 slices shown (2 of 8)]
[im 1/26]
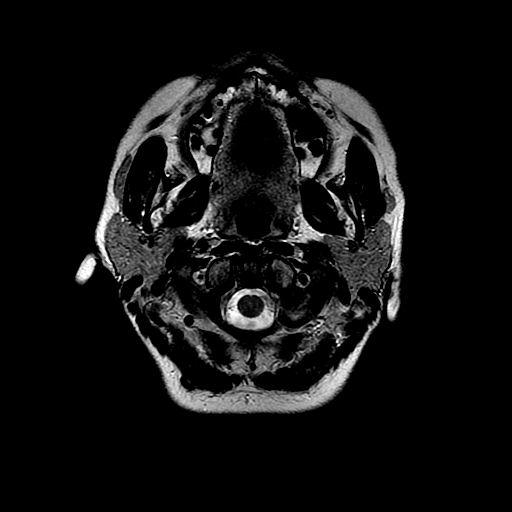

[Series 8: T2 · sagittal · 3.0mm · 0.43mm/px · 1 of 13 slices shown (3 of 8)]
[im 1/13]
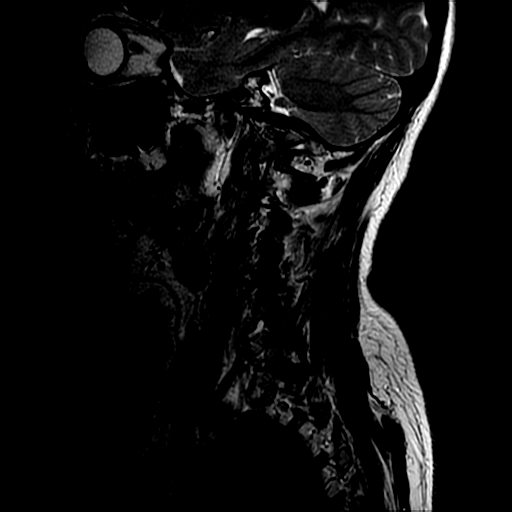

[Series 8: T2 · sagittal · 3.0mm · 0.43mm/px · 1 of 13 slices shown (4 of 8)]
[im 1/13]
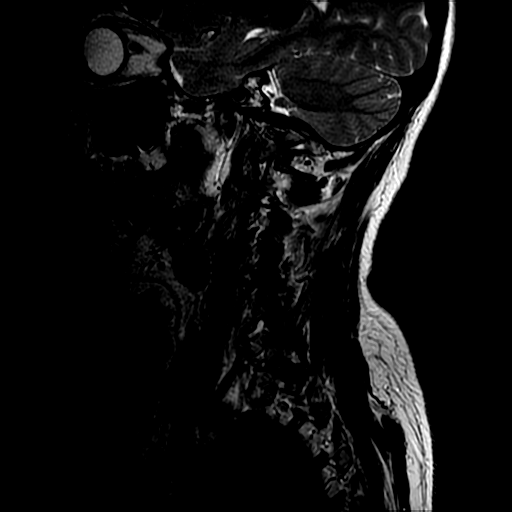

[Series 9: FLAIR · axial · 3.0mm · 0.43mm/px · 1 of 26 slices shown (1 of 2)]
[im 1/26]
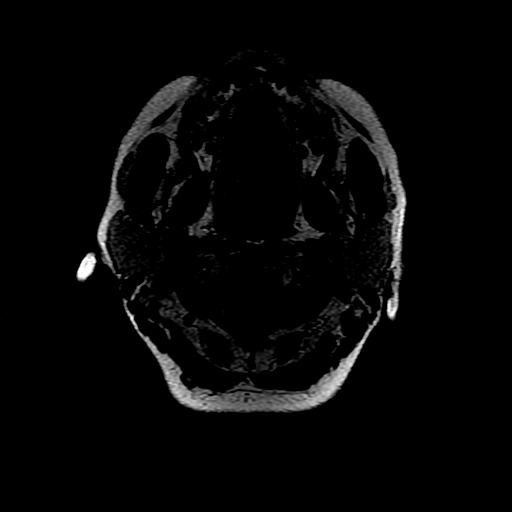

[Series 9: FLAIR · axial · 3.0mm · 0.43mm/px · 1 of 26 slices shown (2 of 2)]
[im 1/26]
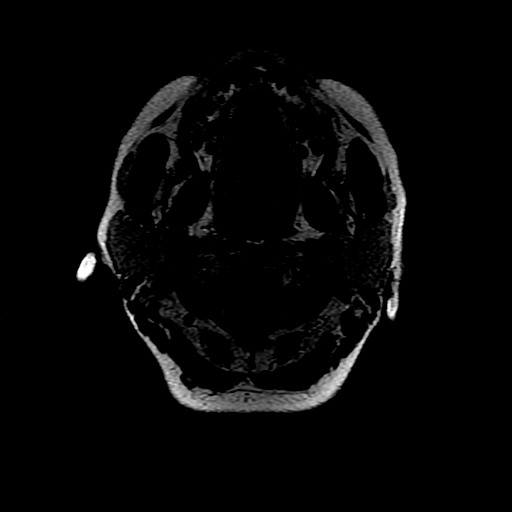

[Series 11: T1 · axial · 3.0mm · 0.47mm/px · 1 of 104 slices shown (3 of 3)]
[im 1/104]
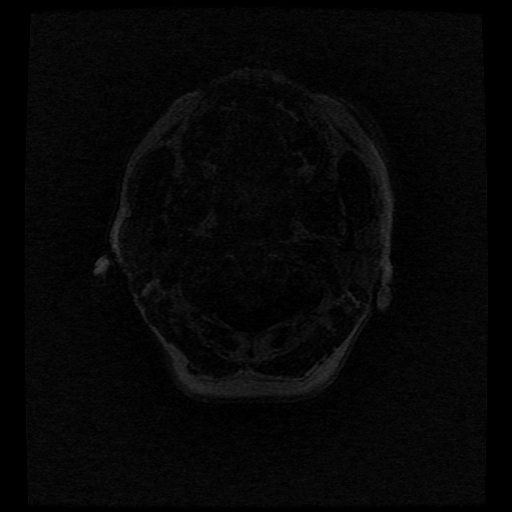

[Series 12: T2 · coronal · 5.0mm · 0.39mm/px · 1 of 27 slices shown (5 of 8)]
[im 1/27]
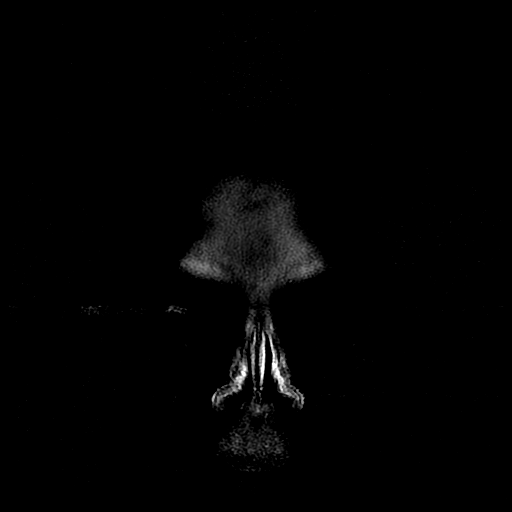

[Series 12: T2 · coronal · 5.0mm · 0.39mm/px · 1 of 27 slices shown (6 of 8)]
[im 1/27]
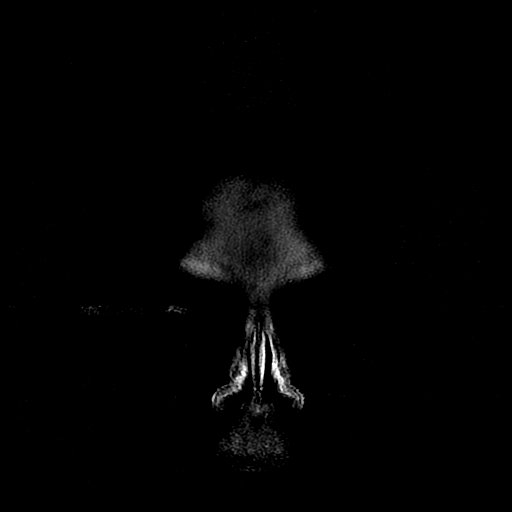

[Series 16: T2 · axial · 3.0mm · 0.39mm/px · 1 of 27 slices shown (7 of 8)]
[im 1/27]
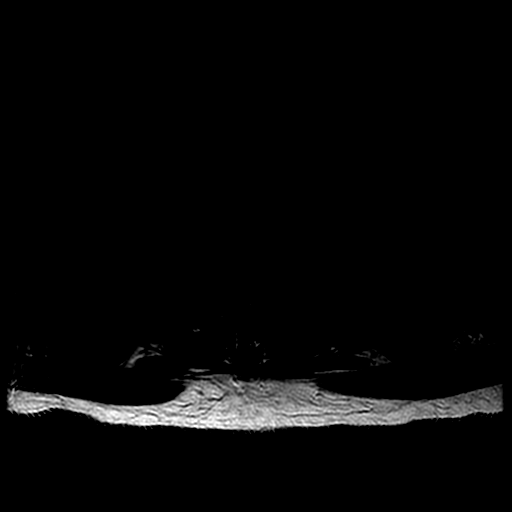

[Series 16: T2 · axial · 3.0mm · 0.39mm/px · 1 of 27 slices shown (8 of 8)]
[im 1/27]
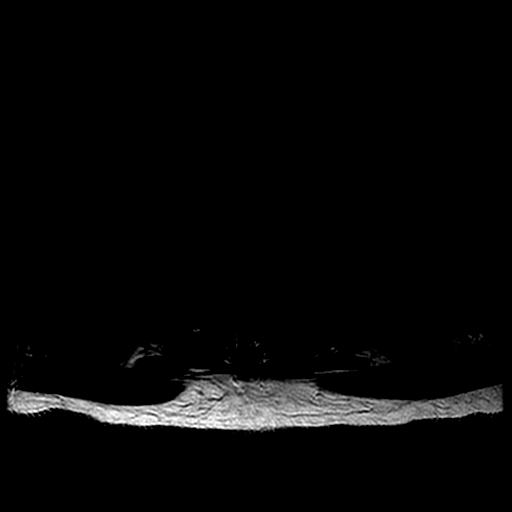

[Series 300: DWI · axial · 3.0mm · 1.09mm/px · z∈[-98,+52]mm · 2 of 52 slices shown (5 of 6)]
[im 1/52]
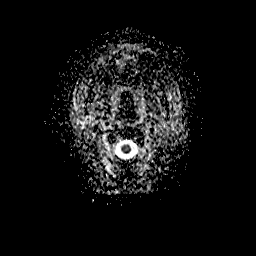
[im 52/52]
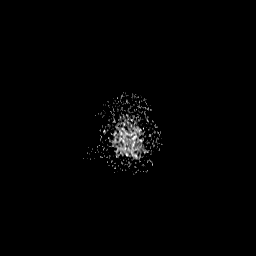

[Series 400: DWI · coronal · 5.0mm · 1.09mm/px · 2 of 36 slices shown (6 of 6)]
[im 1/36]
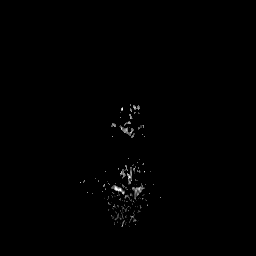
[im 36/36]
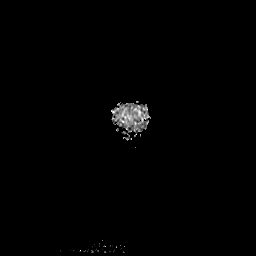

[31 of 48 positions shown; findings below may reference images not displayed]

FINDINGS: MRI HEAD

Brain: There is no acute infarction or intracranial hemorrhage.
There is no intracranial mass, mass effect, or edema. There is no
hydrocephalus or extra-axial fluid collection. Ventricles and sulci
are normal in size and configuration. No abnormal enhancement.

Vascular: Major vessel flow voids at the skull base are preserved.

Skull: Normal marrow signal is preserved.

Sinuses/Orbits: Paranasal sinuses are aerated. Orbits are
unremarkable.

Other: Sella is unremarkable.  Mastoid air cells are clear.

MRI CERVICAL SPINE

Alignment: Physiologic.

Vertebrae: Vertebral body heights are maintained. There is no marrow
edema. There is no suspicious osseous lesion.

Cord: Normal caliber and signal.

Posterior Fossa, vertebral arteries, paraspinal tissues:
Unremarkable.

Disc levels: Intervertebral disc heights and signal are maintained.
Small right foraminal disc osteophyte complex at C5-C6 causing mild
foraminal stenosis.
IMPRESSION: Normal MRI of the brain.

No abnormal cord signal.

Small right foraminal disc osteophyte complex at C5-C6 causing mild
stenosis.

## 2022-03-29 ENCOUNTER — Other Ambulatory Visit: Payer: Self-pay | Admitting: Internal Medicine

## 2022-04-09 DIAGNOSIS — J0100 Acute maxillary sinusitis, unspecified: Secondary | ICD-10-CM | POA: Diagnosis not present

## 2022-10-11 ENCOUNTER — Other Ambulatory Visit: Payer: Self-pay | Admitting: Internal Medicine

## 2022-11-25 ENCOUNTER — Other Ambulatory Visit: Payer: Self-pay

## 2022-11-25 ENCOUNTER — Encounter (HOSPITAL_COMMUNITY): Payer: Self-pay | Admitting: *Deleted

## 2022-11-25 ENCOUNTER — Emergency Department (HOSPITAL_COMMUNITY)
Admission: EM | Admit: 2022-11-25 | Discharge: 2022-11-26 | Discharge status: Against Medical Advice | Payer: BLUE CROSS/BLUE SHIELD | Attending: Emergency Medicine | Admitting: Emergency Medicine

## 2022-11-25 DIAGNOSIS — Y92410 Unspecified street and highway as the place of occurrence of the external cause: Secondary | ICD-10-CM | POA: Diagnosis not present

## 2022-11-25 DIAGNOSIS — R200 Anesthesia of skin: Secondary | ICD-10-CM | POA: Diagnosis not present

## 2022-11-25 DIAGNOSIS — Z5321 Procedure and treatment not carried out due to patient leaving prior to being seen by health care provider: Secondary | ICD-10-CM | POA: Diagnosis not present

## 2022-11-25 NOTE — ED Notes (Signed)
Pt told me she was leaving and left. This tech removed pt's wristband.
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
## 2022-11-30 ENCOUNTER — Telehealth: Payer: Self-pay

## 2022-11-30 NOTE — Transitions of Care (Post Inpatient/ED Visit) (Signed)
   11/30/2022  Name: Angela Webb MRN: [id number] DOB: [birth year]  Today's TOC FU Call Status: Today's TOC FU Call Status:: Unsuccessul Call (1st Attempt) Unsuccessful Call (1st Attempt) Date: 11/30/22  Attempted to reach the patient regarding the most recent Inpatient/ED visit.  Follow Up Plan: Additional outreach attempts will be made to reach the patient to complete the Transitions of Care (Post Inpatient/ED visit) call.   Signature Karena Addison, LPN [facility] Nurse Health Advisor Direct Dial [phone]

## 2022-12-01 NOTE — Transitions of Care (Post Inpatient/ED Visit) (Unsigned)
   12/01/2022  Name: Angela Webb MRN: [id number] DOB: 08/30/1970  Today's TOC FU Call Status: Today's TOC FU Call Status:: Unsuccessful Call (2nd Attempt) Unsuccessful Call (1st Attempt) Date: 11/30/22 Unsuccessful Call (2nd Attempt) Date: 12/01/22  Attempted to reach the patient regarding the most recent Inpatient/ED visit.  Follow Up Plan: Additional outreach attempts will be made to reach the patient to complete the Transitions of Care (Post Inpatient/ED visit) call.   Signature Karena Addison, LPN [facility] Nurse Health Advisor Direct Dial [phone]

## 2022-12-02 NOTE — Transitions of Care (Post Inpatient/ED Visit) (Signed)
   12/02/2022  Name: Angela Webb MRN: [id number] DOB: 11-11-1970  Today's TOC FU Call Status: Today's TOC FU Call Status:: Unsuccessful Call (3rd Attempt) Unsuccessful Call (1st Attempt) Date: 11/30/22 Unsuccessful Call (2nd Attempt) Date: 12/01/22 Unsuccessful Call (3rd Attempt) Date: 12/02/22  Attempted to reach the patient regarding the most recent Inpatient/ED visit.  Follow Up Plan: No further outreach attempts will be made at this time. We have been unable to contact the patient.  Signature Karena Addison, LPN [facility] Nurse Health Advisor Direct Dial [phone]

## 2023-08-16 DIAGNOSIS — R531 Weakness: Secondary | ICD-10-CM | POA: Diagnosis not present

## 2023-08-16 DIAGNOSIS — M4802 Spinal stenosis, cervical region: Secondary | ICD-10-CM | POA: Diagnosis not present

## 2023-08-16 DIAGNOSIS — M79662 Pain in left lower leg: Secondary | ICD-10-CM | POA: Diagnosis not present

## 2023-08-16 DIAGNOSIS — M542 Cervicalgia: Secondary | ICD-10-CM | POA: Diagnosis not present

## 2023-08-16 DIAGNOSIS — M79605 Pain in left leg: Secondary | ICD-10-CM | POA: Diagnosis not present

## 2023-09-06 ENCOUNTER — Encounter: Payer: Self-pay | Admitting: Family Medicine

## 2023-09-06 DIAGNOSIS — G709 Myoneural disorder, unspecified: Secondary | ICD-10-CM | POA: Insufficient documentation

## 2023-09-06 NOTE — Progress Notes (Unsigned)
 New Patient Office Visit  Subjective    Patient ID: Angela Webb, female    DOB: 08-15-1970  Age: 53 y.o. MRN: [id number]  CC: No chief complaint on file.   HPI Angela Webb presents to establish care today. Up to date on routine vaccines. Up to date on routine screenings.  Receives regular dental and eye care.  Reports eating well, sleeping well, feeling well overall.  Reports compliance with medication regimen.  Denies other concerns today.  Outpatient Encounter Medications as of 09/07/2023  Medication Sig   cyclobenzaprine  (FLEXERIL ) 5 MG tablet TAKE 1 TABLET (5 MG TOTAL) BY MOUTH DAILY. **ANNUAL APPOINTMENT NEEDED FOR ADDITIONAL REFILLS**   estradiol (ESTRACE) 1 MG tablet Take 1 mg by mouth daily.   HYDROcodone -acetaminophen  (NORCO) 5-325 MG tablet 1 tabs po q6 hours prn pain   ibuprofen  (ADVIL ) 800 MG tablet ibuprofen  800 mg tablet  TAKE 1 TABLET 3 TIMES A DAY OR AS NEEDED FOR DISCOMFORT   montelukast  (SINGULAIR ) 10 MG tablet Take 1 tablet (10 mg total) by mouth daily.   ondansetron  (ZOFRAN ) 4 MG tablet TAKE 1 TABLET BY MOUTH EVERY 8 HOURS AS NEEDED FOR NAUSEA AND VOMITING   pantoprazole  (PROTONIX ) 40 MG tablet Take 1 tablet every day by oral route.   predniSONE  (DELTASONE ) 10 MG tablet 3 tabs by mouth per day for 3 days,2tabs per day for 3 days,1tab per day for 3 days   progesterone (PROMETRIUM) 100 MG capsule Take 100 mg by mouth daily.   progesterone (PROMETRIUM) 100 MG capsule Take 100 mg by mouth at bedtime.   rosuvastatin  (CRESTOR ) 10 MG tablet Take 1 tablet (10 mg total) by mouth daily.   rosuvastatin  (CRESTOR ) 10 MG tablet TAKE 1 TABLET BY MOUTH EVERY DAY; PATIENT NEEDS APPT PRIOR TO FUTURE REFILLS   triamcinolone  (NASACORT  ALLERGY 24HR) 55 MCG/ACT AERO nasal inhaler Nasacort    No facility-administered encounter medications on file as of 09/07/2023.    Past Medical History:  Diagnosis Date   Allergic rhinitis    ALLERGIC RHINITIS 10/03/2007   Complication of  anesthesia    GERD (gastroesophageal reflux disease)    High cholesterol    Hyperlipidemia    HYPERLIPIDEMIA 08/08/2008   Medical history non-contributory    Neuromuscular disorder (HCC)    DDD   PONV (postoperative nausea and vomiting)     Past Surgical History:  Procedure Laterality Date   EXCISION MASS UPPER EXTREMETIES Right 01/21/2017   Procedure: RIGHT THUMB EXCISION CYST AND DEBRIEDMENT DISTAL INTERPHALANGEAL JOINT;  Surgeon: Brunilda Capra, MD;  Location: [facility];  Service: Orthopedics;  Laterality: Right;   I & D EXTREMITY Left 08/22/2015   Procedure: IRRIGATION AND DEBRIDEMENT EXTREMITY;  Surgeon: Brunilda Capra, MD;  Location: [facility];  Service: Orthopedics;  Laterality: Left;  I & d left thumb   INCISION AND DRAINAGE Left 08/29/2015   Procedure: REPEAT INCISION AND DRAINAGE LEFT THUMB;  Surgeon: Brunilda Capra, MD;  Location: [facility];  Service: Orthopedics;  Laterality: Left;    Family History  Problem Relation Age of Onset   Arthritis Mother    Diabetes Brother        type I, deceased    Social History   Socioeconomic History   Marital status: Married    Spouse name: Not on file   Number of children: 1   Years of education: Not on file   Highest education level: 12th grade  Occupational History   Occupation: Clinical biochemist  Employer: [employer]  Tobacco Use   Smoking status: Never   Smokeless tobacco: Never  Substance and Sexual Activity   Alcohol use: Yes    Alcohol/week: 1.0 standard drink of alcohol    Types: 1 Glasses of wine per week    Comment: social   Drug use: No   Sexual activity: Yes  Other Topics Concern   Not on file  Social History Narrative   ** Merged History Encounter **       Social Drivers of Health   Financial Resource Strain: Low Risk  (09/02/2023)   Overall Financial Resource Strain (CARDIA)    Difficulty of Paying Living Expenses: Not hard at all  Food Insecurity: No  Food Insecurity (09/02/2023)   Hunger Vital Sign    Worried About Running Out of Food in the Last Year: Never true    Ran Out of Food in the Last Year: Never true  Transportation Needs: No Transportation Needs (09/02/2023)   PRAPARE - Administrator, Civil Service (Medical): No    Lack of Transportation (Non-Medical): No  Physical Activity: Insufficiently Active (09/02/2023)   Exercise Vital Sign    Days of Exercise per Week: 3 days    Minutes of Exercise per Session: 30 min  Stress: Stress Concern Present (09/02/2023)   [employer] of Occupational Health - Occupational Stress Questionnaire    Feeling of Stress : To some extent  Social Connections: Socially Integrated (09/02/2023)   Social Connection and Isolation Panel [NHANES]    Frequency of Communication with Friends and Family: More than three times a week    Frequency of Social Gatherings with Friends and Family: More than three times a week    Attends Religious Services: More than 4 times per year    Active Member of [employer] or Organizations: Yes    Attends Engineer, structural: More than 4 times per year    Marital Status: Married  Catering manager Violence: Not At Risk (08/16/2023)   Received from [facility]   HITS    Over the last 12 months how often did your partner physically hurt you?: Never    Over the last 12 months how often did your partner insult you or talk down to you?: Never    Over the last 12 months how often did your partner threaten you with physical harm?: Never    Over the last 12 months how often did your partner scream or curse at you?: Never    ROS Per HPI      Objective    LMP 06/23/2012   Physical Exam Vitals and nursing note reviewed.  Constitutional:      General: She is not in acute distress.    Appearance: Normal appearance. She is normal weight.  HENT:     Head: Normocephalic and atraumatic.     Right Ear: External ear normal.     Left Ear: External ear normal.      Nose: Nose normal.     Mouth/Throat:     Mouth: Mucous membranes are moist.     Pharynx: Oropharynx is clear.  Eyes:     Extraocular Movements: Extraocular movements intact.     Pupils: Pupils are equal, round, and reactive to light.  Cardiovascular:     Rate and Rhythm: Normal rate and regular rhythm.     Pulses: Normal pulses.     Heart sounds: Normal heart sounds.  Pulmonary:     Effort: Pulmonary effort is normal.  No respiratory distress.     Breath sounds: Normal breath sounds. No wheezing, rhonchi or rales.  Musculoskeletal:        General: Normal range of motion.     Cervical back: Normal range of motion.     Right lower leg: No edema.     Left lower leg: No edema.  Lymphadenopathy:     Cervical: No cervical adenopathy.  Neurological:     General: No focal deficit present.     Mental Status: She is alert and oriented to person, place, and time.  Psychiatric:        Mood and Affect: Mood normal.        Thought Content: Thought content normal.         Assessment & Plan:   Neuromuscular disorder (HCC)  Gastroesophageal reflux disease without esophagitis  Mixed hyperlipidemia     No follow-ups on file.   Wellington Half, FNP

## 2023-09-06 NOTE — Patient Instructions (Incomplete)
 Welcome to [facility]!  Thank you for choosing us  for your Primary Care needs.   We offer in person and video appointments for your convenience. You may call our office to schedule appointments, or you may schedule appointments with me through MyChart.   The best way to get in contact with me is via MyChart message. This will get to me faster than a phone call, unless there is an emergency, then please call 911.  The lab is located downstairs in the Sports Medicine building, we also have xray available there.   I have ordered an abdominal ultrasound to further evaluate your pain.   We are checking labs today, will be in contact with any results that require further attention  Take by mouth daily for 6 days. Take 6 tablets on day 1, 5 tablets on day 2, 4 tablets on day 3, 3 tablets on day 4, 2 tablets on day 5, 1 tablet on day 6

## 2023-09-07 ENCOUNTER — Ambulatory Visit (INDEPENDENT_AMBULATORY_CARE_PROVIDER_SITE_OTHER): Payer: BLUE CROSS/BLUE SHIELD | Admitting: Family Medicine

## 2023-09-07 ENCOUNTER — Encounter: Payer: Self-pay | Admitting: Family Medicine

## 2023-09-07 VITALS — BP 110/68 | HR 73 | Temp 98.0°F | Ht 60.25 in | Wt 140.0 lb

## 2023-09-07 DIAGNOSIS — R197 Diarrhea, unspecified: Secondary | ICD-10-CM

## 2023-09-07 DIAGNOSIS — R1011 Right upper quadrant pain: Secondary | ICD-10-CM | POA: Diagnosis not present

## 2023-09-07 DIAGNOSIS — G709 Myoneural disorder, unspecified: Secondary | ICD-10-CM

## 2023-09-07 DIAGNOSIS — K219 Gastro-esophageal reflux disease without esophagitis: Secondary | ICD-10-CM

## 2023-09-07 DIAGNOSIS — M7711 Lateral epicondylitis, right elbow: Secondary | ICD-10-CM | POA: Diagnosis not present

## 2023-09-07 DIAGNOSIS — E782 Mixed hyperlipidemia: Secondary | ICD-10-CM

## 2023-09-07 LAB — CBC WITH DIFFERENTIAL/PLATELET
Basophils Absolute: 0 10*3/uL (ref 0.0–0.1)
Basophils Relative: 0.6 % (ref 0.0–3.0)
Eosinophils Absolute: 0 10*3/uL (ref 0.0–0.7)
Eosinophils Relative: 0.5 % (ref 0.0–5.0)
HCT: 38.4 % (ref 36.0–46.0)
Hemoglobin: 12.9 g/dL (ref 12.0–15.0)
Lymphocytes Relative: 47.5 % — ABNORMAL HIGH (ref 12.0–46.0)
Lymphs Abs: 3.8 10*3/uL (ref 0.7–4.0)
MCHC: 33.6 g/dL (ref 30.0–36.0)
MCV: 94.2 fl (ref 78.0–100.0)
Monocytes Absolute: 0.5 10*3/uL (ref 0.1–1.0)
Monocytes Relative: 6.3 % (ref 3.0–12.0)
Neutro Abs: 3.6 10*3/uL (ref 1.4–7.7)
Neutrophils Relative %: 45.1 % (ref 43.0–77.0)
Platelets: 300 10*3/uL (ref 150.0–400.0)
RBC: 4.07 Mil/uL (ref 3.87–5.11)
RDW: 14 % (ref 11.5–15.5)
WBC: 7.9 10*3/uL (ref 4.0–10.5)

## 2023-09-07 LAB — COMPREHENSIVE METABOLIC PANEL WITH GFR
ALT: 15 U/L (ref 0–35)
AST: 15 U/L (ref 0–37)
Albumin: 4.4 g/dL (ref 3.5–5.2)
Alkaline Phosphatase: 72 U/L (ref 39–117)
BUN: 20 mg/dL (ref 6–23)
CO2: 27 meq/L (ref 19–32)
Calcium: 9.3 mg/dL (ref 8.4–10.5)
Chloride: 105 meq/L (ref 96–112)
Creatinine, Ser: 1.13 mg/dL (ref 0.40–1.20)
GFR: 55.19 mL/min — ABNORMAL LOW (ref >60.00)
Glucose, Bld: 120 mg/dL — ABNORMAL HIGH (ref 70–99)
Potassium: 3.6 meq/L (ref 3.5–5.1)
Sodium: 140 meq/L (ref 135–145)
Total Bilirubin: 0.4 mg/dL (ref 0.2–1.2)
Total Protein: 6.8 g/dL (ref 6.0–8.3)

## 2023-09-07 LAB — AMYLASE: Amylase: 54 U/L (ref 27–131)

## 2023-09-07 LAB — LIPASE: Lipase: 46 U/L (ref 11.0–59.0)

## 2023-09-07 MED ORDER — PREDNISONE 10 MG (21) PO TBPK: ORAL_TABLET | Freq: Every day | ORAL | 0 refills | Status: AC

## 2023-09-08 ENCOUNTER — Encounter: Payer: Self-pay | Admitting: Family Medicine

## 2023-09-13 ENCOUNTER — Other Ambulatory Visit: Payer: Self-pay

## 2023-09-13 ENCOUNTER — Telehealth: Payer: Self-pay | Admitting: Family Medicine

## 2023-09-13 DIAGNOSIS — R1011 Right upper quadrant pain: Secondary | ICD-10-CM

## 2023-09-13 DIAGNOSIS — R197 Diarrhea, unspecified: Secondary | ICD-10-CM

## 2023-09-13 NOTE — Telephone Encounter (Signed)
 Copied from CRM [id number]. Topic: General - Other >> Sep 10, 2023  4:01 PM Dorisann Garre T wrote: Reason for CRM: patient has a new place imaging on [address] in [place] called [facility]  for her ultrasound the place she was referred to at [facility] is out of network she found   a place that is network with her insurance she would like the referral sent there phone number  [phone] [address]

## 2023-09-13 NOTE — Telephone Encounter (Signed)
 Incorrect phone number, however I was able to find more information and I have spoke to the imaging department at that location and faxed the order over to them.   Phone [phone] Fax [phone]

## 2023-09-16 ENCOUNTER — Ambulatory Visit: Admitting: Family Medicine

## 2023-09-21 DIAGNOSIS — R197 Diarrhea, unspecified: Secondary | ICD-10-CM | POA: Diagnosis not present

## 2023-09-21 DIAGNOSIS — R1011 Right upper quadrant pain: Secondary | ICD-10-CM | POA: Diagnosis not present

## 2023-10-07 NOTE — Progress Notes (Unsigned)
 Complete physical exam  Patient: Angela Webb   DOB: [birth year]   53 y.o. Female  MRN: [id number]  Subjective:     No chief complaint on file.   Angela Webb is a 53 y.o. female who presents today for a complete physical exam. She reports consuming a general diet.  She generally feels fairly well. She reports sleeping fairly well. She does not have additional problems to discuss today.    Most recent fall risk assessment:    01/09/2020    3:44 PM  Fall Risk   Falls in the past year? 0  Number falls in past yr: 0  Injury with Fall? 0  Risk for fall due to : No Fall Risks  Follow up Falls evaluation completed     Most recent depression screenings:    01/09/2020    3:44 PM 07/08/2018    2:35 PM  PHQ 2/9 Scores  PHQ - 2 Score 0 0    Vision:Within last year and Dental: No current dental problems and Receives regular dental care  Patient Active Problem List   Diagnosis Date Noted   Right tennis elbow 09/07/2023   RUQ pain 09/07/2023   Diarrhea 09/07/2023   Neuromuscular disorder (HCC)    Left otitis externa 01/09/2020   Right cervical radiculopathy 01/09/2020   Bruising 12/28/2018   Varicose veins of leg with pain, left 12/28/2018   Leg cramping 12/28/2018   Leg cramps 07/08/2018   Acute gastroenteritis 06/09/2018   Colitis 02/15/2018   Frequent nosebleeds 06/22/2017   Acute hearing loss, right 06/22/2017   Lateral epicondylitis of left elbow 05/28/2017   Acute shoulder bursitis, right 04/22/2017   Right knee pain 04/07/2017   Early menopause 11/18/2016   Thumb pain, right 11/05/2016   Right arm pain 08/21/2015   Low back pain 08/21/2015   Lumbar degenerative disc disease 11/06/2014   Hot flashes 06/05/2014   Acute upper respiratory infection 06/05/2014   Right shoulder pain 06/05/2014   Bilateral leg pain 01/26/2014   Lower back pain 08/22/2012   GERD (gastroesophageal reflux disease) 08/20/2011   Preventative health care 07/28/2010   Hyperlipidemia  08/08/2008   Allergic rhinitis 10/03/2007   VERTIGO 10/03/2007   Past Surgical History:  Procedure Laterality Date   EXCISION MASS UPPER EXTREMETIES Right 01/21/2017   Procedure: RIGHT THUMB EXCISION CYST AND DEBRIEDMENT DISTAL INTERPHALANGEAL JOINT;  Surgeon: Brunilda Capra, MD;  Location: [facility];  Service: Orthopedics;  Laterality: Right;   I & D EXTREMITY Left 08/22/2015   Procedure: IRRIGATION AND DEBRIDEMENT EXTREMITY;  Surgeon: Brunilda Capra, MD;  Location: [facility];  Service: Orthopedics;  Laterality: Left;  I & d left thumb   INCISION AND DRAINAGE Left 08/29/2015   Procedure: REPEAT INCISION AND DRAINAGE LEFT THUMB;  Surgeon: Brunilda Capra, MD;  Location: [facility];  Service: Orthopedics;  Laterality: Left;   Social History   Tobacco Use   Smoking status: Never   Smokeless tobacco: Never  Substance Use Topics   Alcohol use: Not Currently    Alcohol/week: 1.0 standard drink of alcohol    Types: 1 Glasses of wine per week    Comment: social   Drug use: No   Allergies  Allergen Reactions   Levofloxacin  Anaphylaxis   Amoxicillin  Other (See Comments)    SJS   Levofloxacin      REACTION: LEG PAIN/CRAMPS   Tramadol Nausea Only      Patient Care Team: Wellington Half, FNP  as PCP - General (Family Medicine)   Outpatient Medications Prior to Visit  Medication Sig   cyclobenzaprine  (FLEXERIL ) 5 MG tablet TAKE 1 TABLET (5 MG TOTAL) BY MOUTH DAILY. **ANNUAL APPOINTMENT NEEDED FOR ADDITIONAL REFILLS**   estradiol (ESTRACE) 1 MG tablet Take 1 mg by mouth daily. (Patient not taking: Reported on 09/07/2023)   HYDROcodone -acetaminophen  (NORCO) 5-325 MG tablet 1 tabs po q6 hours prn pain   ibuprofen  (ADVIL ) 800 MG tablet ibuprofen  800 mg tablet  TAKE 1 TABLET 3 TIMES A DAY OR AS NEEDED FOR DISCOMFORT   ondansetron  (ZOFRAN ) 4 MG tablet TAKE 1 TABLET BY MOUTH EVERY 8 HOURS AS NEEDED FOR NAUSEA AND VOMITING   rosuvastatin  (CRESTOR ) 10 MG  tablet Take 1 tablet (10 mg total) by mouth daily.   rosuvastatin  (CRESTOR ) 10 MG tablet TAKE 1 TABLET BY MOUTH EVERY DAY; PATIENT NEEDS APPT PRIOR TO FUTURE REFILLS   No facility-administered medications prior to visit.    ROS        Objective:     LMP 06/23/2012  BP Readings from Last 3 Encounters:  09/07/23 110/68  11/25/22 (!) 142/96  02/09/20 118/82   Wt Readings from Last 3 Encounters:  09/07/23 140 lb (63.5 kg)  11/25/22 130 lb 1.1 oz (59 kg)  02/09/20 130 lb (59 kg)   SpO2 Readings from Last 3 Encounters:  09/07/23 98%  11/25/22 100%  02/09/20 99%      Physical Exam Vitals and nursing note reviewed.  Constitutional:      General: She is not in acute distress.    Appearance: Normal appearance. She is not ill-appearing.  HENT:     Head: Normocephalic.     Right Ear: Tympanic membrane and ear canal normal.     Left Ear: Tympanic membrane and ear canal normal.     Nose: Nose normal.     Mouth/Throat:     Mouth: Mucous membranes are moist.     Pharynx: Oropharynx is clear.  Eyes:     Extraocular Movements: Extraocular movements intact.     Conjunctiva/sclera: Conjunctivae normal.     Pupils: Pupils are equal, round, and reactive to light.  Neck:     Thyroid : No thyromegaly.     Vascular: No carotid bruit.  Cardiovascular:     Rate and Rhythm: Normal rate and regular rhythm.     Pulses: Normal pulses.     Heart sounds: Normal heart sounds.  Pulmonary:     Effort: Pulmonary effort is normal. No respiratory distress.     Breath sounds: Normal breath sounds. No wheezing, rhonchi or rales.  Abdominal:     General: Abdomen is flat. Bowel sounds are normal.     Palpations: Abdomen is soft.  Musculoskeletal:        General: Normal range of motion.     Cervical back: Normal range of motion and neck supple.     Right lower leg: No edema.     Left lower leg: No edema.  Lymphadenopathy:     Cervical: No cervical adenopathy.  Skin:    General: Skin is warm  and dry.     Capillary Refill: Capillary refill takes less than 2 seconds.  Neurological:     General: No focal deficit present.     Mental Status: She is alert and oriented to person, place, and time.  Psychiatric:        Mood and Affect: Mood normal.        Behavior: Behavior normal.  No results found for any visits on 10/08/23. {Show previous labs (optional):23779}     Assessment & Plan:    Routine Health Maintenance and Physical Exam   Health Maintenance  Topic Date Due   Hepatitis C Screening  Never done   DTaP/Tdap/Td vaccine (1 - Tdap) Never done   Pap with HPV screening  Never done   Colon Cancer Screening  Never done   Mammogram  11/06/2020   COVID-19 Vaccine (1 - 2024-25 season) Never done   Zoster (Shingles) Vaccine (1 of 2) 12/08/2023*   Flu Shot  12/03/2023   HIV Screening  Completed   HPV Vaccine  Aged Out   Meningitis B Vaccine  Aged Out  *Topic was postponed. The date shown is not the original due date.     Discussed health benefits of physical activity, and encouraged her to engage in regular exercise appropriate for her age and condition.  There are no diagnoses linked to this encounter.   No follow-ups on file.     Wellington Half, FNP

## 2023-10-07 NOTE — Patient Instructions (Incomplete)
 We have completed your physical today.  Vaccines:  Referrals:

## 2023-10-08 ENCOUNTER — Encounter: Payer: Self-pay | Admitting: Family Medicine

## 2023-10-08 ENCOUNTER — Ambulatory Visit: Admitting: Family Medicine

## 2023-10-08 VITALS — BP 132/88 | HR 60 | Temp 98.3°F | Ht 60.25 in | Wt 137.0 lb

## 2023-10-08 DIAGNOSIS — E663 Overweight: Secondary | ICD-10-CM

## 2023-10-08 DIAGNOSIS — R232 Flushing: Secondary | ICD-10-CM | POA: Diagnosis not present

## 2023-10-08 DIAGNOSIS — R1013 Epigastric pain: Secondary | ICD-10-CM | POA: Diagnosis not present

## 2023-10-08 DIAGNOSIS — Z5320 Procedure and treatment not carried out because of patient's decision for unspecified reasons: Secondary | ICD-10-CM

## 2023-10-08 DIAGNOSIS — Z2821 Immunization not carried out because of patient refusal: Secondary | ICD-10-CM

## 2023-10-08 DIAGNOSIS — F5101 Primary insomnia: Secondary | ICD-10-CM | POA: Diagnosis not present

## 2023-10-08 DIAGNOSIS — Z0001 Encounter for general adult medical examination with abnormal findings: Secondary | ICD-10-CM

## 2023-10-08 DIAGNOSIS — Z1211 Encounter for screening for malignant neoplasm of colon: Secondary | ICD-10-CM

## 2023-10-08 DIAGNOSIS — E782 Mixed hyperlipidemia: Secondary | ICD-10-CM

## 2023-10-08 DIAGNOSIS — M150 Primary generalized (osteo)arthritis: Secondary | ICD-10-CM

## 2023-10-08 DIAGNOSIS — R739 Hyperglycemia, unspecified: Secondary | ICD-10-CM | POA: Insufficient documentation

## 2023-10-08 DIAGNOSIS — Z6826 Body mass index (BMI) 26.0-26.9, adult: Secondary | ICD-10-CM

## 2023-10-08 DIAGNOSIS — Z0000 Encounter for general adult medical examination without abnormal findings: Secondary | ICD-10-CM

## 2023-10-08 DIAGNOSIS — Z124 Encounter for screening for malignant neoplasm of cervix: Secondary | ICD-10-CM

## 2023-10-08 MED ORDER — ZOLPIDEM TARTRATE 5 MG PO TABS
5.0000 mg | ORAL_TABLET | Freq: Every evening | ORAL | 1 refills | Status: DC | PRN
Start: 2023-10-08 — End: 2024-02-14

## 2023-10-08 MED ORDER — TRAMADOL HCL 50 MG PO TABS: 50.0000 mg | ORAL_TABLET | Freq: Four times a day (QID) | ORAL | 0 refills | Status: DC | PRN

## 2023-10-08 MED ORDER — FAMOTIDINE 20 MG PO TABS: 20.0000 mg | ORAL_TABLET | Freq: Two times a day (BID) | ORAL | 1 refills | Status: DC

## 2023-10-08 MED ORDER — ROSUVASTATIN CALCIUM 10 MG PO TABS: 10.0000 mg | ORAL_TABLET | Freq: Every day | ORAL | 11 refills | Status: AC

## 2023-10-08 NOTE — Assessment & Plan Note (Signed)
 CMP, A1C ordered, will tx accordingly

## 2023-10-08 NOTE — Assessment & Plan Note (Signed)
To GYN

## 2023-10-08 NOTE — Assessment & Plan Note (Signed)
 Lipids ordered, will dose adjust prn

## 2023-10-08 NOTE — Assessment & Plan Note (Signed)
 Discussed healthy diet and activity level

## 2023-10-08 NOTE — Assessment & Plan Note (Signed)
 Declines all vaccines Declines mammogram

## 2023-10-08 NOTE — Assessment & Plan Note (Signed)
Ref to GYN

## 2023-10-08 NOTE — Assessment & Plan Note (Signed)
 Tramadol 50mg , 1 tab q6h prn

## 2023-10-08 NOTE — Assessment & Plan Note (Signed)
-

## 2023-10-08 NOTE — Assessment & Plan Note (Signed)
Ref to GI

## 2023-10-08 NOTE — Assessment & Plan Note (Signed)
 Trial Ambien 5mg  Discussed sleep hygeine

## 2023-10-14 ENCOUNTER — Ambulatory Visit: Admitting: Family Medicine

## 2023-10-14 ENCOUNTER — Other Ambulatory Visit: Payer: Self-pay

## 2023-10-14 VITALS — BP 108/72 | HR 60 | Ht 60.25 in | Wt 136.0 lb

## 2023-10-14 DIAGNOSIS — M7711 Lateral epicondylitis, right elbow: Secondary | ICD-10-CM | POA: Diagnosis not present

## 2023-10-14 DIAGNOSIS — M25531 Pain in right wrist: Secondary | ICD-10-CM | POA: Diagnosis not present

## 2023-10-14 MED ORDER — LIDOCAINE 5 % EX PTCH: 1.0000 | MEDICATED_PATCH | CUTANEOUS | 2 refills | Status: AC

## 2023-10-14 NOTE — Progress Notes (Signed)
   Joanna Muck, PhD, LAT, ATC acting as a scribe for Garlan Juniper, MD.  Angela Webb is a 53 y.o. female who presents to [facility] today for R elbow pain ongoing intermittently since March. Pt locates pain to the posterior aspect of her R elbow. She is RHD.   Pt also c/o R wrist pain x 2-wks. Pt locates pain to the volar aspect of her R wrist over the carpal.   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   Exam:  BP 108/72   Pulse 60   Ht 5' 0.25 (1.53 m)   Wt 136 lb (61.7 kg)   LMP 06/23/2012   SpO2 98%   BMI 26.34 kg/m  General: Well Developed, well nourished, and in no acute distress.   MSK: Right elbow normal-appearing Tender palpation lateral epicondyle.  Normal elbow motion and strength. Pain is present with resisted wrist extension.    Lab and Radiology Results  Procedure: Real-time Ultrasound Guided Injection of right lateral epicondyle common extensor tendon origin Device: Philips Affiniti 50G/GE Logiq Images permanently stored and available for review in PACS Verbal informed consent obtained.  Discussed risks and benefits of procedure. Warned about infection, bleeding, hyperglycemia damage to structures among others. Patient expresses understanding and agreement Time-out conducted.   Noted no overlying erythema, induration, or other signs of local infection.   Skin prepped in a sterile fashion.   Local anesthesia: Topical Ethyl chloride.   With sterile technique and under real time ultrasound guidance: 40 mg of Kenalog  and 1 mL of Marcaine  injected into lateral epicondyle.  Fluid seen entering the epicondyle.   Completed without difficulty   Pain immediately resolved suggesting accurate placement of the medication.   Advised to call if fevers/chills, erythema, induration, drainage, or persistent bleeding.   Images permanently stored and available for review in the ultrasound unit.  Impression: Technically successful ultrasound guided injection.         Assessment and Plan: 53 y.o. female with right elbow pain due to lateral epicondylitis.  Plan for home exercise program and injection today.  Also use lidocaine  patches as she has had some benefit with that.  If not improving she could contact me and I can arrange for occupational therapy.  Home exercise program reviewed today.   PDMP not reviewed this encounter. Orders Placed This Encounter  Procedures   US  LIMITED JOINT SPACE STRUCTURES UP RIGHT(NO LINKED CHARGES)    Reason for Exam (SYMPTOM  OR DIAGNOSIS REQUIRED):   wrist pain    Preferred imaging location?:   [facility]   Meds ordered this encounter  Medications   lidocaine  (LIDODERM ) 5 %    Sig: Place 1 patch onto the skin daily. Remove & Discard patch within 12 hours or as directed by MD    Dispense:  30 patch    Refill:  2     Discussed warning signs or symptoms. Please see discharge instructions. Patient expresses understanding.   The above documentation has been reviewed and is accurate and complete Garlan Juniper, M.D.

## 2023-10-14 NOTE — Patient Instructions (Addendum)
 Thank you for coming in today.   You received an injection today. Seek immediate medical attention if the joint becomes red, extremely painful, or is oozing fluid.   Counter-force elbow strap  Consider increasing the grip diameter on your kayak paddle  If not better let me know and I can get you into hand therapy.   Youtube home exercise program.

## 2023-10-18 ENCOUNTER — Ambulatory Visit: Payer: Self-pay

## 2023-10-18 NOTE — Telephone Encounter (Signed)
Third attempt to reach pt. Left message to call back.

## 2023-10-18 NOTE — Telephone Encounter (Signed)
Attempted to reach patient, unable to reach

## 2023-10-18 NOTE — Telephone Encounter (Signed)
 Second attempt to reached pt. Message left.

## 2023-10-18 NOTE — Telephone Encounter (Signed)
Left message to call back about symptoms. °

## 2023-10-18 NOTE — Telephone Encounter (Signed)
 Copied from CRM [id number]. Topic: Clinical - Medication Question >> Oct 18, 2023 10:13 AM Chuck Crater wrote: Reason for CRM: Patient has yellow mucous drainage and has not been able to sleep well/ coughing. She has been taking over the counter meds. Patient wants to know if some medication can be called in.

## 2023-10-21 ENCOUNTER — Ambulatory Visit: Payer: Self-pay

## 2023-10-21 DIAGNOSIS — J40 Bronchitis, not specified as acute or chronic: Secondary | ICD-10-CM | POA: Diagnosis not present

## 2023-10-21 NOTE — Telephone Encounter (Signed)
  FYI Only or Action Required?: FYI only for provider.  Patient was last seen in primary care on 10/08/2023 by Wellington Half, FNP. Called Nurse Triage reporting Nasal Congestion and Cough. Symptoms began a week ago. Interventions attempted: Rest, hydration, or home remedies. Symptoms are: gradually worsening.  Triage Disposition: See HCP Within 4 Hours (Or PCP Triage)  Patient/caregiver understands and will follow disposition?: Yes  Copied from CRM [id number]. Topic: Clinical - Red Word Triage >> Oct 21, 2023 11:20 AM Albertha Alosa wrote: [facility] that prompted transfer to Nurse Triage: Patient has drainage and congestion for about a week now, has now started wheezing Reason for Disposition  [1] MILD difficulty breathing (e.g., minimal/no SOB at rest, SOB with walking, pulse <100) AND [2] still present when not coughing  Answer Assessment - Initial Assessment Questions 1. ONSET: When did the cough begin?      Several days 2. SEVERITY: How bad is the cough today?       3. SPUTUM: Describe the color of your sputum (none, dry cough; clear, white, yellow, green)     Yellow  4. HEMOPTYSIS: Are you coughing up any blood? If so ask: How much? (flecks, streaks, tablespoons, etc.)     None 5. DIFFICULTY BREATHING: Are you having difficulty breathing? If Yes, ask: How bad is it? (e.g., mild, moderate, severe)    - MILD: No SOB at rest, mild SOB with walking, speaks normally in sentences, can lie down, no retractions, pulse < 100.    - MODERATE: SOB at rest, SOB with minimal exertion and prefers to sit, cannot lie down flat, speaks in phrases, mild retractions, audible wheezing, pulse 100-120.    - SEVERE: Very SOB at rest, speaks in single words, struggling to breathe, sitting hunched forward, retractions, pulse > 120      denies 6. FEVER: Do you have a fever? If Yes, ask: What is your temperature, how was it measured, and when did it start?     Denies  7. OTHER SYMPTOMS: Do you  have any other symptoms? (e.g., runny nose, wheezing, chest pain)       Wheezing intermittent with deep breath/cough  Additional info: no appointments available in office until Monday, will proceed to urgent care.  Protocols used: Cough - Acute Non-Productive-A-AH

## 2023-10-31 ENCOUNTER — Other Ambulatory Visit: Payer: Self-pay | Admitting: Family Medicine

## 2023-10-31 DIAGNOSIS — R1013 Epigastric pain: Secondary | ICD-10-CM

## 2023-11-02 ENCOUNTER — Other Ambulatory Visit: Payer: Self-pay | Admitting: Family Medicine

## 2023-11-02 DIAGNOSIS — M150 Primary generalized (osteo)arthritis: Secondary | ICD-10-CM

## 2023-12-28 ENCOUNTER — Telehealth: Payer: Self-pay | Admitting: Family Medicine

## 2023-12-28 NOTE — Telephone Encounter (Signed)
 Called patient about referral we received in June. Left VM with call back number if patient needs an appt.

## 2024-01-12 DIAGNOSIS — R2231 Localized swelling, mass and lump, right upper limb: Secondary | ICD-10-CM | POA: Diagnosis not present

## 2024-01-12 DIAGNOSIS — S6000XA Contusion of unspecified finger without damage to nail, initial encounter: Secondary | ICD-10-CM | POA: Diagnosis not present

## 2024-02-03 ENCOUNTER — Other Ambulatory Visit: Payer: Self-pay | Admitting: Family Medicine

## 2024-02-03 DIAGNOSIS — M150 Primary generalized (osteo)arthritis: Secondary | ICD-10-CM

## 2024-02-13 ENCOUNTER — Other Ambulatory Visit: Payer: Self-pay | Admitting: Family Medicine

## 2024-02-13 DIAGNOSIS — F5101 Primary insomnia: Secondary | ICD-10-CM

## 2024-02-21 ENCOUNTER — Ambulatory Visit: Payer: Self-pay

## 2024-02-21 NOTE — Telephone Encounter (Signed)
 2nd attempt; no answer. Left voicemail for patient to return call for nurse triage.   Copied from CRM #[id number]. Topic: Clinical - Red Word Triage >> Feb 21, 2024  1:44 PM Mesmerise C wrote: [facility] that prompted transfer to Nurse Triage: Patient states she has discoloration in her elbows, but also has pain in left ear when putting pressure on it laying down gets worse with pain

## 2024-02-21 NOTE — Telephone Encounter (Signed)
 Copied from CRM #[id number]. Topic: Clinical - Red Word Triage >> Feb 21, 2024  1:44 PM Mesmerise C wrote: [facility] that prompted transfer to Nurse Triage: Patient states she has discoloration in her elbows, but also has pain in left ear when putting pressure on it laying down gets worse with pain >> Feb 21, 2024  1:52 PM Mesmerise C wrote: When trying to get to NT pt disconnected attempted to call back twice no answer, sent message to NT chat

## 2024-02-22 NOTE — Telephone Encounter (Signed)
 Noted

## 2024-02-23 ENCOUNTER — Ambulatory Visit (INDEPENDENT_AMBULATORY_CARE_PROVIDER_SITE_OTHER): Admitting: Internal Medicine

## 2024-02-23 VITALS — BP 108/64 | HR 58 | Temp 98.7°F | Ht 60.25 in | Wt 137.0 lb

## 2024-02-23 DIAGNOSIS — J069 Acute upper respiratory infection, unspecified: Secondary | ICD-10-CM

## 2024-02-23 DIAGNOSIS — M7711 Lateral epicondylitis, right elbow: Secondary | ICD-10-CM | POA: Diagnosis not present

## 2024-02-23 DIAGNOSIS — R252 Cramp and spasm: Secondary | ICD-10-CM

## 2024-02-23 DIAGNOSIS — J309 Allergic rhinitis, unspecified: Secondary | ICD-10-CM

## 2024-02-23 MED ORDER — CYCLOBENZAPRINE HCL 5 MG PO TABS: 5.0000 mg | ORAL_TABLET | Freq: Three times a day (TID) | ORAL | 5 refills | Status: AC | PRN

## 2024-02-23 MED ORDER — HYDROCODONE BIT-HOMATROP MBR 5-1.5 MG/5ML PO SOLN: 5.0000 mL | Freq: Four times a day (QID) | ORAL | 0 refills | Status: AC | PRN

## 2024-02-23 MED ORDER — AZITHROMYCIN 250 MG PO TABS: ORAL_TABLET | ORAL | 1 refills | Status: AC

## 2024-02-23 NOTE — Progress Notes (Unsigned)
 Patient ID: Angela Webb, female   DOB: [birth year], 53 y.o.   MRN: [id number]        Chief Complaint: follow up right elbow skin change, URI, left foot muscle cramp,        HPI:  Angela Webb is a 53 y.o. female  Here with 2-3 days acute onset fever, facial pain, pressure, headache, general weakness and malaise, and greenish d/c, with mild ST and cough, but pt denies chest pain, wheezing, increased sob or doe, orthopnea, PND, increased LE swelling, palpitations, dizziness or syncope. Does have several wks ongoing nasal allergy symptoms with clearish congestion, itch and sneezing, without fever, pain, ST, cough, swelling or wheezing.  Also with muscle severe cramp to left foot most nights for 2 wks.  Also has hx of right lateral epicondylitis, and now has some lessening of the pigment to that area, now off white rather pinkish hue as before, for several wks.         Wt Readings from Last 3 Encounters:  02/23/24 137 lb (62.1 kg)  10/14/23 136 lb (61.7 kg)  10/08/23 137 lb (62.1 kg)   BP Readings from Last 3 Encounters:  02/23/24 108/64  10/14/23 108/72  10/08/23 132/88         Past Medical History:  Diagnosis Date   Allergic rhinitis    ALLERGIC RHINITIS 10/03/2007   Allergy 2012   Complication of anesthesia    GERD (gastroesophageal reflux disease)    High cholesterol    Hyperlipidemia    HYPERLIPIDEMIA 08/08/2008   Medical history non-contributory    Neuromuscular disorder (HCC)    DDD   PONV (postoperative nausea and vomiting)    Past Surgical History:  Procedure Laterality Date   EXCISION MASS UPPER EXTREMETIES Right 01/21/2017   Procedure: RIGHT THUMB EXCISION CYST AND DEBRIEDMENT DISTAL INTERPHALANGEAL JOINT;  Surgeon: Murrell Drivers, MD;  Location: [facility];  Service: Orthopedics;  Laterality: Right;   I & D EXTREMITY Left 08/22/2015   Procedure: IRRIGATION AND DEBRIDEMENT EXTREMITY;  Surgeon: Drivers Murrell, MD;  Location: [facility];  Service:  Orthopedics;  Laterality: Left;  I & d left thumb   INCISION AND DRAINAGE Left 08/29/2015   Procedure: REPEAT INCISION AND DRAINAGE LEFT THUMB;  Surgeon: Drivers Murrell, MD;  Location: [facility];  Service: Orthopedics;  Laterality: Left;    reports that she has never smoked. She has never used smokeless tobacco. She reports that she does not currently use alcohol after a past usage of about 1.0 standard drink of alcohol per week. She reports that she does not use drugs. family history includes Arthritis in her mother; Diabetes in her brother. Allergies  Allergen Reactions   Levofloxacin  Anaphylaxis   Amoxicillin  Other (See Comments)    SJS   Levofloxacin      REACTION: LEG PAIN/CRAMPS   Current Outpatient Medications on File Prior to Visit  Medication Sig Dispense Refill   famotidine  (PEPCID ) 20 MG tablet TAKE 1 TABLET BY MOUTH TWICE A DAY 180 tablet 1   ibuprofen  (ADVIL ) 800 MG tablet ibuprofen  800 mg tablet  TAKE 1 TABLET 3 TIMES A DAY OR AS NEEDED FOR DISCOMFORT     lidocaine  (LIDODERM ) 5 % Place 1 patch onto the skin daily. Remove & Discard patch within 12 hours or as directed by MD 30 patch 2   ondansetron  (ZOFRAN ) 4 MG tablet TAKE 1 TABLET BY MOUTH EVERY 8 HOURS AS NEEDED FOR NAUSEA AND VOMITING 18 tablet  1   rosuvastatin  (CRESTOR ) 10 MG tablet Take 1 tablet (10 mg total) by mouth daily. 30 tablet 11   traMADol  (ULTRAM ) 50 MG tablet TAKE 1 TABLET BY MOUTH EVERY 6 HOURS AS NEEDED. 30 tablet 1   zolpidem  (AMBIEN ) 5 MG tablet TAKE 1 TABLET BY MOUTH AT BEDTIME AS NEEDED FOR SLEEP. 20 tablet 1   No current facility-administered medications on file prior to visit.        ROS:  All others reviewed and negative.  Objective        PE:  BP 108/64   Pulse (!) 58   Temp 98.7 F (37.1 C) (Temporal)   Ht 5' 0.25 (1.53 m)   Wt 137 lb (62.1 kg)   LMP 06/23/2012   SpO2 98%   BMI 26.53 kg/m                 Constitutional: Pt appears in NAD               HENT: Head: NCAT.                 Right Ear: External ear normal.                 Left Ear: External ear normal. Bilat tm's with mild erythema.  Max sinus areas mild tender.  Pharynx with mild erythema, no exudate               Eyes: . Pupils are equal, round, and reactive to light. Conjunctivae and EOM are normal               Nose: without d/c or deformity               Neck: Neck supple. Gross normal ROM               Cardiovascular: Normal rate and regular rhythm.                 Pulmonary/Chest: Effort normal and breath sounds without rales or wheezing.                Abd:  Soft, NT, ND, + BS, no organomegaly               Neurological: Pt is alert. At baseline orientation, motor grossly intact               Skin: Skin is warm. No rashes, no other new lesions, LE edema - none               Psychiatric: Pt behavior is normal without agitation   Micro: none  Cardiac tracings I have personally interpreted today:  none  Pertinent Radiological findings (summarize): none   Lab Results  Component Value Date   WBC 7.9 09/07/2023   HGB 12.9 09/07/2023   HCT 38.4 09/07/2023   PLT 300.0 09/07/2023   GLUCOSE 120 (H) 09/07/2023   CHOL 195 12/28/2018   TRIG 157.0 (H) 12/28/2018   HDL 79.90 12/28/2018   LDLDIRECT 108.8 08/22/2012   LDLCALC 84 12/28/2018   ALT 15 09/07/2023   AST 15 09/07/2023   NA 140 09/07/2023   K 3.6 09/07/2023   CL 105 09/07/2023   CREATININE 1.13 09/07/2023   BUN 20 09/07/2023   CO2 27 09/07/2023   TSH 0.96 12/28/2018   INR 1.0 02/09/2020   Assessment/Plan:  Angela Webb is a 54 y.o. White or Caucasian [1] female with  has a past medical  history of Allergic rhinitis, ALLERGIC RHINITIS (10/03/2007), Allergy (2012), Complication of anesthesia, GERD (gastroesophageal reflux disease), High cholesterol, Hyperlipidemia, HYPERLIPIDEMIA (08/08/2008), Medical history non-contributory, Neuromuscular disorder (HCC), and PONV (postoperative nausea and vomiting).  Right tennis elbow With  post inflammatory skin whitening, pt reassured, no further eval needed  Nocturnal muscle cramp Rather severe several nights in last few wks - for flexeril  5 mg at bedtime prn  Allergic rhinitis Mild to mod, for otc allegra 180 mg every day prn,  to f/u any worsening symptoms or concerns  Acute upper respiratory infection Mild to mod, for antibx course zpack, cough med prn,  to f/u any worsening symptoms or concerns  Followup: Return if symptoms worsen or fail to improve.  Lynwood Rush, MD 02/24/2024 7:07 PM [facility]

## 2024-02-23 NOTE — Patient Instructions (Addendum)
 Please take all new medication as prescribed  - the antibiotic, and cough medicine  Please continue all other medications as before, and refills have been done for the flexeril  muscle relaxer  Please have the pharmacy call with any other refills you may need.  Please keep your appointments with your specialists as you may have planned  We can hold on lab testing today

## 2024-02-24 ENCOUNTER — Encounter: Payer: Self-pay | Admitting: Internal Medicine

## 2024-02-24 DIAGNOSIS — R252 Cramp and spasm: Secondary | ICD-10-CM | POA: Insufficient documentation

## 2024-02-24 NOTE — Assessment & Plan Note (Signed)
 With post inflammatory skin whitening, pt reassured, no further eval needed

## 2024-02-24 NOTE — Assessment & Plan Note (Signed)
 Mild to mod, for otc allegra 180 mg every day prn,  to f/u any worsening symptoms or concerns

## 2024-02-24 NOTE — Assessment & Plan Note (Signed)
Mild to mod, for antibx course zpack, cough med prn,  to f/u any worsening symptoms or concerns 

## 2024-02-24 NOTE — Assessment & Plan Note (Signed)
 Rather severe several nights in last few wks - for flexeril  5 mg at bedtime prn

## 2024-03-22 ENCOUNTER — Other Ambulatory Visit: Payer: Self-pay | Admitting: Family Medicine

## 2024-03-22 DIAGNOSIS — M7711 Lateral epicondylitis, right elbow: Secondary | ICD-10-CM

## 2024-03-22 DIAGNOSIS — M150 Primary generalized (osteo)arthritis: Secondary | ICD-10-CM

## 2024-06-04 ENCOUNTER — Other Ambulatory Visit: Payer: Self-pay | Admitting: Family Medicine

## 2024-06-04 DIAGNOSIS — R1013 Epigastric pain: Secondary | ICD-10-CM

## 2024-10-09 ENCOUNTER — Encounter: Admitting: Family Medicine
